# Patient Record
Sex: Female | Born: 1985 | Race: White | Hispanic: No | Smoking: Current every day smoker
Health system: Southern US, Community
[De-identification: ages and names within clinical notes are randomized; demographics above are authoritative.]

## PROBLEM LIST (undated history)

## (undated) ENCOUNTER — Emergency Department: Discharge status: Absent without leave | Payer: Self-pay

## (undated) ENCOUNTER — Inpatient Hospital Stay (HOSPITAL_COMMUNITY): Payer: Self-pay

## (undated) DIAGNOSIS — O24419 Gestational diabetes mellitus in pregnancy, unspecified control: Secondary | ICD-10-CM

## (undated) DIAGNOSIS — Z8751 Personal history of pre-term labor: Secondary | ICD-10-CM

## (undated) DIAGNOSIS — Z720 Tobacco use: Secondary | ICD-10-CM

## (undated) HISTORY — PX: NOSE SURGERY: SHX723

## (undated) HISTORY — DX: Tobacco use: Z72.0

## (undated) HISTORY — DX: Gestational diabetes mellitus in pregnancy, unspecified control: O24.419

---
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
## 2008-07-08 DIAGNOSIS — O24419 Gestational diabetes mellitus in pregnancy, unspecified control: Secondary | ICD-10-CM

## 2008-07-08 HISTORY — DX: Gestational diabetes mellitus in pregnancy, unspecified control: O24.419

## 2010-12-06 ENCOUNTER — Emergency Department (HOSPITAL_COMMUNITY)
Admission: EM | Admit: 2010-12-06 | Discharge: 2010-12-07 | Disposition: A | Discharge status: Home / Self Care | Payer: Self-pay | Attending: Emergency Medicine | Admitting: Emergency Medicine

## 2010-12-06 DIAGNOSIS — M436 Torticollis: Secondary | ICD-10-CM | POA: Insufficient documentation

## 2010-12-06 DIAGNOSIS — M542 Cervicalgia: Secondary | ICD-10-CM | POA: Insufficient documentation

## 2014-08-05 ENCOUNTER — Encounter (HOSPITAL_COMMUNITY): Payer: Self-pay | Admitting: *Deleted

## 2014-08-05 ENCOUNTER — Inpatient Hospital Stay (HOSPITAL_COMMUNITY): Payer: BLUE CROSS/BLUE SHIELD

## 2014-08-05 ENCOUNTER — Inpatient Hospital Stay (HOSPITAL_COMMUNITY)
Admission: AD | Admit: 2014-08-05 | Discharge: 2014-08-05 | Disposition: A | Discharge status: Home / Self Care | Payer: BLUE CROSS/BLUE SHIELD | Source: Ambulatory Visit | Attending: Obstetrics and Gynecology | Admitting: Obstetrics and Gynecology

## 2014-08-05 ENCOUNTER — Other Ambulatory Visit: Payer: Self-pay | Admitting: Advanced Practice Midwife

## 2014-08-05 DIAGNOSIS — O99331 Smoking (tobacco) complicating pregnancy, first trimester: Secondary | ICD-10-CM | POA: Insufficient documentation

## 2014-08-05 DIAGNOSIS — F17210 Nicotine dependence, cigarettes, uncomplicated: Secondary | ICD-10-CM | POA: Diagnosis not present

## 2014-08-05 DIAGNOSIS — O034 Incomplete spontaneous abortion without complication: Secondary | ICD-10-CM

## 2014-08-05 DIAGNOSIS — Z3A01 Less than 8 weeks gestation of pregnancy: Secondary | ICD-10-CM | POA: Diagnosis not present

## 2014-08-05 DIAGNOSIS — O209 Hemorrhage in early pregnancy, unspecified: Secondary | ICD-10-CM | POA: Diagnosis present

## 2014-08-05 LAB — WET PREP, GENITAL
TRICH WET PREP: NONE SEEN
Yeast Wet Prep HPF POC: NONE SEEN

## 2014-08-05 LAB — CBC
HCT: 38.9 % (ref 36.0–46.0)
HEMOGLOBIN: 13.4 g/dL (ref 12.0–15.0)
MCH: 30.3 pg (ref 26.0–34.0)
MCHC: 34.4 g/dL (ref 30.0–36.0)
MCV: 88 fL (ref 78.0–100.0)
Platelets: 231 10*3/uL (ref 150–400)
RBC: 4.42 MIL/uL (ref 3.87–5.11)
RDW: 12.9 % (ref 11.5–15.5)
WBC: 14.3 10*3/uL — ABNORMAL HIGH (ref 4.0–10.5)

## 2014-08-05 LAB — ABO/RH: ABO/RH(D): A POS

## 2014-08-05 LAB — URINALYSIS, ROUTINE W REFLEX MICROSCOPIC
BILIRUBIN URINE: NEGATIVE
GLUCOSE, UA: NEGATIVE mg/dL
KETONES UR: NEGATIVE mg/dL
NITRITE: NEGATIVE
PH: 5.5 (ref 5.0–8.0)
PROTEIN: NEGATIVE mg/dL
Specific Gravity, Urine: 1.025 (ref 1.005–1.030)
UROBILINOGEN UA: 0.2 mg/dL (ref 0.0–1.0)

## 2014-08-05 LAB — URINE MICROSCOPIC-ADD ON

## 2014-08-05 LAB — POCT PREGNANCY, URINE: PREG TEST UR: POSITIVE — AB

## 2014-08-05 LAB — HCG, QUANTITATIVE, PREGNANCY: HCG, BETA CHAIN, QUANT, S: 114051 m[IU]/mL — AB (ref <5)

## 2014-08-05 MED ORDER — OXYCODONE-ACETAMINOPHEN 5-325 MG PO TABS: 1.0000 | ORAL_TABLET | Freq: Four times a day (QID) | ORAL | Status: DC | PRN

## 2014-08-05 MED ORDER — PROMETHAZINE HCL 25 MG PO TABS: 12.5000 mg | ORAL_TABLET | Freq: Four times a day (QID) | ORAL | Status: DC | PRN

## 2014-08-05 MED ORDER — IBUPROFEN 600 MG PO TABS: 600.0000 mg | ORAL_TABLET | Freq: Four times a day (QID) | ORAL | Status: DC | PRN

## 2014-08-05 NOTE — MAU Provider Note (Signed)
Chief Complaint: Vaginal Bleeding   First Provider Initiated Contact with Patient 08/05/14 1018     SUBJECTIVE HPI: Shannon Archer is a 29 y.o. G2P1001 at [redacted]w[redacted]d by LMP who presents to maternity admissions reporting vaginal bleeding in early pregnancy x 2 weeks with onset of clots and tissue today. She is soaking a pad every 3 hours approximately.  The pt describes the tissue as dark/black in color and looking like a balloon.  She reports mild abdominal cramping, denies vaginal itching/burning, urinary symptoms, h/a, dizziness, n/v, or fever/chills.     History reviewed. No pertinent past medical history. Past Surgical History  Procedure Laterality Date  . Cesarean section     History   Social History  . Marital Status: Divorced    Spouse Name: N/A    Number of Children: N/A  . Years of Education: N/A   Occupational History  . Not on file.   Social History Main Topics  . Smoking status: Current Every Day Smoker -- 0.50 packs/day    Types: Cigarettes  . Smokeless tobacco: Never Used  . Alcohol Use: No  . Drug Use: No  . Sexual Activity: Yes   Other Topics Concern  . Not on file   Social History Narrative  . No narrative on file   No current facility-administered medications on file prior to encounter.   No current outpatient prescriptions on file prior to encounter.   Allergies  Allergen Reactions  . Penicillins Nausea And Vomiting    ROS: Pertinent items in HPI  OBJECTIVE Blood pressure 124/63, pulse 93, temperature 98.9 F (37.2 C), temperature source Oral, resp. rate 18, height 5' 1.5" (1.562 m), weight 58.06 kg (128 lb), last menstrual period 06/11/2014. GENERAL: Well-developed, well-nourished female in no acute distress.  HEENT: Normocephalic HEART: normal rate RESP: normal effort ABDOMEN: Soft, non-tender EXTREMITIES: Nontender, no edema NEURO: Alert and oriented Pelvic exam: Cervix pink, visually closed, without lesion, small amount dark red bleeding  mixed with mucus, vaginal walls and external genitalia normal Bimanual exam: Cervix 0/long/high, firm, anterior, neg CMT, uterus nontender, nonenlarged, adnexa without tenderness, enlargement, or mass  LAB RESULTS Results for orders placed or performed during the hospital encounter of 08/05/14 (from the past 24 hour(s))  Urinalysis, Routine w reflex microscopic     Status: Abnormal   Collection Time: 08/05/14  9:40 AM  Result Value Ref Range   Color, Urine YELLOW YELLOW   APPearance HAZY (A) CLEAR   Specific Gravity, Urine 1.025 1.005 - 1.030   pH 5.5 5.0 - 8.0   Glucose, UA NEGATIVE NEGATIVE mg/dL   Hgb urine dipstick LARGE (A) NEGATIVE   Bilirubin Urine NEGATIVE NEGATIVE   Ketones, ur NEGATIVE NEGATIVE mg/dL   Protein, ur NEGATIVE NEGATIVE mg/dL   Urobilinogen, UA 0.2 0.0 - 1.0 mg/dL   Nitrite NEGATIVE NEGATIVE   Leukocytes, UA TRACE (A) NEGATIVE  Urine microscopic-add on     Status: Abnormal   Collection Time: 08/05/14  9:40 AM  Result Value Ref Range   Squamous Epithelial / LPF FEW (A) RARE   WBC, UA 0-2 <3 WBC/hpf   Bacteria, UA FEW (A) RARE   Urine-Other MUCOUS PRESENT   Pregnancy, urine POC     Status: Abnormal   Collection Time: 08/05/14  9:46 AM  Result Value Ref Range   Preg Test, Ur POSITIVE (A) NEGATIVE  CBC     Status: Abnormal   Collection Time: 08/05/14 10:25 AM  Result Value Ref Range   WBC 14.3 (H) 4.0 -  10.5 K/uL   RBC 4.42 3.87 - 5.11 MIL/uL   Hemoglobin 13.4 12.0 - 15.0 g/dL   HCT 16.138.9 09.636.0 - 04.546.0 %   MCV 88.0 78.0 - 100.0 fL   MCH 30.3 26.0 - 34.0 pg   MCHC 34.4 30.0 - 36.0 g/dL   RDW 40.912.9 81.111.5 - 91.415.5 %   Platelets 231 150 - 400 K/uL  hCG, quantitative, pregnancy     Status: Abnormal   Collection Time: 08/05/14 10:25 AM  Result Value Ref Range   hCG, Beta Chain, Quant, S 114051 (H) <5 mIU/mL  ABO/Rh     Status: None (Preliminary result)   Collection Time: 08/05/14 10:25 AM  Result Value Ref Range   ABO/RH(D) A POS   Wet prep, genital     Status:  Abnormal   Collection Time: 08/05/14 10:34 AM  Result Value Ref Range   Yeast Wet Prep HPF POC NONE SEEN NONE SEEN   Trich, Wet Prep NONE SEEN NONE SEEN   Clue Cells Wet Prep HPF POC FEW (A) NONE SEEN   WBC, Wet Prep HPF POC MODERATE (A) NONE SEEN    IMAGING [place] Ob Comp Less 14 Wks  08/05/2014   CLINICAL DATA:  Vaginal bleeding in first trimester pregnancy.  EXAM: OBSTETRIC <14 WK US AND TRANSVAGINAL OB US  TECHNIQUE: Both transabdominal and transvaginal ultrasound examinations were performed for complete evaluation of the gestation as well as the maternal uterus, adnexal regions, and pelvic cul-de-sac. Transvaginal technique was performed to assess early pregnancy.  COMPARISON:  None.  FINDINGS: Intrauterine gestational sac: Visualized/normal in shape.  Yolk sac:  Visualized  Embryo:  Visualized  Cardiac Activity: Absent  CRL:   12  mm   7 w 2 d                  US EDC: 03/22/2015  Maternal uterus/adnexae: Small amount of subchorionic hemorrhage noted in lower uterine segment. Both ovaries are normal in appearance. No adnexal mass or free fluid identified.  IMPRESSION: Findings meet definitive criteria for failed pregnancy. This follows SRU consensus guidelines: Diagnostic Criteria for Nonviable Pregnancy Early in the First Trimester. Macy Mis Engl J Med [phone];369:1443-51.   Electronically Signed   By: Myles RosenthalJohn  Stahl M.D.   On: 08/05/2014 12:21   [place] Ob Transvaginal  08/05/2014   CLINICAL DATA:  Vaginal bleeding in first trimester pregnancy.  EXAM: OBSTETRIC <14 WK US AND TRANSVAGINAL OB US  TECHNIQUE: Both transabdominal and transvaginal ultrasound examinations were performed for complete evaluation of the gestation as well as the maternal uterus, adnexal regions, and pelvic cul-de-sac. Transvaginal technique was performed to assess early pregnancy.  COMPARISON:  None.  FINDINGS: Intrauterine gestational sac: Visualized/normal in shape.  Yolk sac:  Visualized  Embryo:  Visualized  Cardiac Activity: Absent  CRL:    12  mm   7 w 2 d                  US EDC: 03/22/2015  Maternal uterus/adnexae: Small amount of subchorionic hemorrhage noted in lower uterine segment. Both ovaries are normal in appearance. No adnexal mass or free fluid identified.  IMPRESSION: Findings meet definitive criteria for failed pregnancy. This follows SRU consensus guidelines: Diagnostic Criteria for Nonviable Pregnancy Early in the First Trimester. Macy Mis Engl J Med [phone];369:1443-51.   Electronically Signed   By: Myles RosenthalJohn  Stahl M.D.   On: 08/05/2014 12:21    ASSESSMENT 1. Incomplete miscarriage   2. Vaginal bleeding in pregnancy, first trimester  PLAN Discussed options with pt including expectant management, Cytotec, and D&C.  Pt prefers expectant management at this time.   Discharge home with bleeding precautions F/U with [facility] in next 2 weeks.  Pt has appt scheduled for New Ob.  She will call office to reschedule for miscarriage f/u. Return to MAU as needed for emergencies.    Medication List    TAKE these medications        ibuprofen 600 MG tablet  Commonly known as:  ADVIL,MOTRIN  Take 1 tablet (600 mg total) by mouth every 6 (six) hours as needed.     oxyCODONE-acetaminophen 5-325 MG per tablet  Commonly known as:  PERCOCET/ROXICET  Take 1-2 tablets by mouth every 6 (six) hours as needed.     promethazine 25 MG tablet  Commonly known as:  PHENERGAN  Take 0.5-1 tablets (12.5-25 mg total) by mouth every 6 (six) hours as needed.       Follow-up Information    Follow up with Jeani Hawking, MD.   Specialty:  Obstetrics and Gynecology   Why:  As scheduled    Contact information:   [address] [place] [address] [phone]       Follow up with [facility].   Why:  As needed for emergencies   Contact information:   [address] [id number] mc [place] [address] [phone]      Sharen Counter Certified Nurse-Midwife 08/05/2014  12:55 PM

## 2014-08-05 NOTE — Discharge Instructions (Signed)
Incomplete Miscarriage A miscarriage is the sudden loss of an unborn baby (fetus) before the 20th week of pregnancy. In an incomplete miscarriage, parts of the fetus or placenta (afterbirth) remain in the body.  Having a miscarriage can be an emotional experience. Talk with your health care provider about any questions you may have about miscarrying, the grieving process, and your future pregnancy plans. CAUSES   Problems with the fetal chromosomes that make it impossible for the baby to develop normally. Problems with the baby's genes or chromosomes are most often the result of errors that occur by chance as the embryo divides and grows. The problems are not inherited from the parents.  Infection of the cervix or uterus.  Hormone problems.  Problems with the cervix, such as having an incompetent cervix. This is when the tissue in the cervix is not strong enough to hold the pregnancy.  Problems with the uterus, such as an abnormally shaped uterus, uterine fibroids, or congenital abnormalities.  Certain medical conditions.  Smoking, drinking alcohol, or taking illegal drugs.  Trauma. SYMPTOMS   Vaginal bleeding or spotting, with or without cramps or pain.  Pain or cramping in the abdomen or lower back.  Passing fluid, tissue, or blood clots from the vagina. DIAGNOSIS  Your health care provider will perform a physical exam. You may also have an ultrasound to confirm the miscarriage. Blood or urine tests may also be ordered. TREATMENT   Sometimes medications are given and sometimes a dilation and curettage (D&C) procedure is performed. During a D&C procedure, the cervix is widened (dilated) and any remaining fetal or placental tissue is gently removed from the uterus.  Antibiotic medicines are prescribed if there is an infection. Other medicines may be given to reduce the size of the uterus (contract) if there is a lot of bleeding.  If you have Rh negative blood and your baby was Rh  positive, you will need a Rho (D) immune globulin shot. This shot will protect any future baby from having Rh blood problems in future pregnancies.  You may be confined to bed rest. This means you should stay in bed and only get up to use the bathroom. HOME CARE INSTRUCTIONS   Rest as directed by your health care provider.  Restrict activity as directed by your health care provider. You may be allowed to continue light activity if curettage was not done but you require further treatment.  Keep track of the number of pads you use each day. Keep track of how soaked (saturated) they are. Record this information.  Do not  use tampons.  Do not douche or have sexual intercourse until approved by your health care provider.  Keep all follow-up appointments for reevaluation and continuing management.  Only take over-the-counter or prescription medicines for pain, fever, or discomfort as directed by your health care provider.  Take antibiotic medicine as directed by your health care provider. Make sure you finish it even if you start to feel better. SEEK IMMEDIATE MEDICAL CARE IF:   You experience severe cramps in your stomach, back, or abdomen.  You have an unexplained temperature (make sure to record these temperatures).  You pass large clots or tissue (save these for your health care provider to inspect).  Your bleeding increases.  You become light-headed, weak, or have fainting episodes. MAKE SURE YOU:   Understand these instructions.  Will watch your condition.  Will get help right away if you are not doing well or get worse. Document Released: 06/24/2005  Document Revised: 11/08/2013 Document Reviewed: 01/21/2013 [facility] Patient Information 2015 [employer], [place]. This information is not intended to replace advice given to you by your health care provider. Make sure you discuss any questions you have with your health care provider.

## 2014-08-05 NOTE — MAU Note (Signed)
Passed something when used the restroom, looked like a deflated balloon. Has been bleeding for 2 wks.  First appt was scheduled for 02/08.

## 2014-08-08 LAB — GC/CHLAMYDIA PROBE AMP (~~LOC~~) NOT AT ARMC
CHLAMYDIA, DNA PROBE: POSITIVE — AB
Neisseria Gonorrhea: NEGATIVE

## 2014-08-09 ENCOUNTER — Telehealth (HOSPITAL_COMMUNITY): Payer: Self-pay | Admitting: *Deleted

## 2014-08-09 NOTE — Telephone Encounter (Signed)
Telephone call to patient regarding positive chlamydia culture, patient notified.  Patient has not been treated but does have an appointment today with Dr. Arelia SneddonMccomb and will seek treatment at this visit.  Instructed patient to notify her partner for treatment.  Report faxed to health department.

## 2014-08-10 ENCOUNTER — Encounter (HOSPITAL_COMMUNITY): Payer: Self-pay | Admitting: *Deleted

## 2014-08-10 MED ORDER — CLINDAMYCIN PHOSPHATE 900 MG/50ML IV SOLN
900.0000 mg | INTRAVENOUS | Status: AC
  Administered 2014-08-11: 600 mg via INTRAVENOUS
  Filled 2014-08-10: qty 50

## 2014-08-10 MED ORDER — CIPROFLOXACIN IN D5W 400 MG/200ML IV SOLN
400.0000 mg | INTRAVENOUS | Status: AC
  Administered 2014-08-11: 400 mg via INTRAVENOUS
  Filled 2014-08-10: qty 200

## 2014-08-10 NOTE — H&P (Signed)
Shannon Archer is an 29 y.o. female presents for dilation and evacuation for nonviable first trimester pregnancy. Seen in MAU on 1/29 with bleeding sonogram revealed and nonviable pregnancy at 7+ weeks.  Had some bleeding over the weekend. Came in yesterday for f/u sonogram.  Still with nonviable pregnancy at 8 weeks.  Offered cytotec or dilation and evacuation. Patient comes in for the latter.  Pertinent Gynecological History: Menses: regular every month without intermenstrual spotting Bleeding: na Contraception: none DES exposure: denies Blood transfusions: none Sexually transmitted diseases: positive screen for chlamydia on 1/29 treated yesterday with zithromax Previous GYN Procedures: cesarean section  Last mammogram: na Date: na Last pap: unknow Date: na OB History: G2, P1   Menstrual History: Menarche age: [age]  Patient's last menstrual period was 06/11/2014 (exact date).    No past medical history on file.  Past Surgical History  Procedure Laterality Date  . Cesarean section      No family history on file.  Social History:  reports that she has been smoking Cigarettes.  She has been smoking about 0.50 packs per day. She has never used smokeless tobacco. She reports that she does not drink alcohol or use illicit drugs.  Allergies:  Allergies  Allergen Reactions  . Penicillins Nausea And Vomiting    No prescriptions prior to admission    Review of Systems  All other systems reviewed and are negative.   Last menstrual period 06/11/2014. Physical Exam  Constitutional: She is oriented to person, place, and time. She appears well-nourished.  HENT:  Head: Normocephalic.  Eyes: Conjunctivae are normal. Pupils are equal, round, and reactive to light.  Cardiovascular: Normal rate, regular rhythm and normal heart sounds.   Respiratory: Effort normal and breath sounds normal.  GI: Soft. Bowel sounds are normal.  Genitourinary:  Minimal dark blood per vagina. Uterus 9  week in size  Musculoskeletal: Normal range of motion.  Neurological: She is alert and oriented to person, place, and time.    No results found for this or any previous visit (from the past 24 hour(s)).  No results found.  Assessment/Plan: Nonviable first trimester pregnancy Precede with dilation and evacuation. Risks discussed.  Infection.  Hemorrhage that could require transfusions with risk of AID's and hepatitis.  Excessive bleeding could require hysterectomy.  Risk of uterine perforation with injury to adjacent organs such as bowel.  This could require exploratory surgery to repair.  Risk of DVT and PE.  Patient expresses understanding of indications and risks.  Cal Gindlesperger S 08/10/2014, 10:51 AM

## 2014-08-11 ENCOUNTER — Ambulatory Visit (HOSPITAL_COMMUNITY)
Admission: RE | Admit: 2014-08-11 | Discharge: 2014-08-11 | Disposition: A | Discharge status: Home / Self Care | Payer: BLUE CROSS/BLUE SHIELD | Source: Ambulatory Visit | Attending: Obstetrics and Gynecology | Admitting: Obstetrics and Gynecology

## 2014-08-11 ENCOUNTER — Encounter (HOSPITAL_COMMUNITY): Payer: Self-pay | Admitting: *Deleted

## 2014-08-11 ENCOUNTER — Ambulatory Visit (HOSPITAL_COMMUNITY): Payer: BLUE CROSS/BLUE SHIELD | Admitting: Anesthesiology

## 2014-08-11 ENCOUNTER — Encounter (HOSPITAL_COMMUNITY)
Admission: RE | Disposition: A | Discharge status: Home / Self Care | Payer: Self-pay | Source: Ambulatory Visit | Attending: Obstetrics and Gynecology

## 2014-08-11 DIAGNOSIS — O034 Incomplete spontaneous abortion without complication: Secondary | ICD-10-CM | POA: Diagnosis present

## 2014-08-11 DIAGNOSIS — Z3A01 Less than 8 weeks gestation of pregnancy: Secondary | ICD-10-CM | POA: Insufficient documentation

## 2014-08-11 DIAGNOSIS — Z880 Allergy status to penicillin: Secondary | ICD-10-CM | POA: Insufficient documentation

## 2014-08-11 DIAGNOSIS — O021 Missed abortion: Secondary | ICD-10-CM | POA: Insufficient documentation

## 2014-08-11 DIAGNOSIS — F17210 Nicotine dependence, cigarettes, uncomplicated: Secondary | ICD-10-CM | POA: Insufficient documentation

## 2014-08-11 HISTORY — PX: DILATION AND EVACUATION: SHX1459

## 2014-08-11 LAB — CBC
HEMATOCRIT: 37.2 % (ref 36.0–46.0)
Hemoglobin: 12.6 g/dL (ref 12.0–15.0)
MCH: 30.2 pg (ref 26.0–34.0)
MCHC: 33.9 g/dL (ref 30.0–36.0)
MCV: 89.2 fL (ref 78.0–100.0)
Platelets: 234 10*3/uL (ref 150–400)
RBC: 4.17 MIL/uL (ref 3.87–5.11)
RDW: 13.4 % (ref 11.5–15.5)
WBC: 11.7 10*3/uL — AB (ref 4.0–10.5)

## 2014-08-11 SURGERY — DILATION AND EVACUATION, UTERUS
Anesthesia: Monitor Anesthesia Care | Site: Vagina

## 2014-08-11 MED ORDER — MEPERIDINE HCL 25 MG/ML IJ SOLN: 6.2500 mg | INTRAMUSCULAR | Status: DC | PRN

## 2014-08-11 MED ORDER — DEXAMETHASONE SODIUM PHOSPHATE 10 MG/ML IJ SOLN
INTRAMUSCULAR | Status: DC | PRN
  Administered 2014-08-11: 4 mg via INTRAVENOUS

## 2014-08-11 MED ORDER — FENTANYL CITRATE 0.05 MG/ML IJ SOLN
INTRAMUSCULAR | Status: AC
  Filled 2014-08-11: qty 5

## 2014-08-11 MED ORDER — SCOPOLAMINE 1 MG/3DAYS TD PT72
1.0000 | MEDICATED_PATCH | Freq: Once | TRANSDERMAL | Status: DC
  Administered 2014-08-11: 1.5 mg via TRANSDERMAL

## 2014-08-11 MED ORDER — FUROSEMIDE 10 MG/ML IJ SOLN
INTRAMUSCULAR | Status: AC
  Filled 2014-08-11: qty 2

## 2014-08-11 MED ORDER — PROPOFOL 10 MG/ML IV EMUL
INTRAVENOUS | Status: DC | PRN
  Administered 2014-08-11 (×2): 20 mg via INTRAVENOUS
  Administered 2014-08-11: 30 mg via INTRAVENOUS
  Administered 2014-08-11 (×3): 20 mg via INTRAVENOUS

## 2014-08-11 MED ORDER — FENTANYL CITRATE 0.05 MG/ML IJ SOLN
INTRAMUSCULAR | Status: DC | PRN
  Administered 2014-08-11: 50 ug via INTRAVENOUS
  Administered 2014-08-11: 100 ug via INTRAVENOUS

## 2014-08-11 MED ORDER — ONDANSETRON HCL 4 MG/2ML IJ SOLN: 4.0000 mg | Freq: Once | INTRAMUSCULAR | Status: DC | PRN

## 2014-08-11 MED ORDER — KETOROLAC TROMETHAMINE 30 MG/ML IJ SOLN
INTRAMUSCULAR | Status: AC
  Filled 2014-08-11: qty 1

## 2014-08-11 MED ORDER — PROPOFOL 10 MG/ML IV BOLUS
INTRAVENOUS | Status: AC
  Filled 2014-08-11: qty 40

## 2014-08-11 MED ORDER — DEXAMETHASONE SODIUM PHOSPHATE 4 MG/ML IJ SOLN
INTRAMUSCULAR | Status: AC
  Filled 2014-08-11: qty 1

## 2014-08-11 MED ORDER — FENTANYL CITRATE 0.05 MG/ML IJ SOLN: 25.0000 ug | INTRAMUSCULAR | Status: DC | PRN

## 2014-08-11 MED ORDER — KETOROLAC TROMETHAMINE 30 MG/ML IJ SOLN
INTRAMUSCULAR | Status: DC | PRN
  Administered 2014-08-11: 30 mg via INTRAVENOUS

## 2014-08-11 MED ORDER — LACTATED RINGERS IV SOLN
INTRAVENOUS | Status: DC
  Administered 2014-08-11 (×2): via INTRAVENOUS

## 2014-08-11 MED ORDER — LIDOCAINE HCL (PF) 1 % IJ SOLN
INTRAMUSCULAR | Status: AC
  Filled 2014-08-11: qty 5

## 2014-08-11 MED ORDER — MIDAZOLAM HCL 2 MG/2ML IJ SOLN
INTRAMUSCULAR | Status: AC
  Filled 2014-08-11: qty 2

## 2014-08-11 MED ORDER — ONDANSETRON HCL 4 MG/2ML IJ SOLN
INTRAMUSCULAR | Status: AC
  Filled 2014-08-11: qty 6

## 2014-08-11 MED ORDER — SCOPOLAMINE 1 MG/3DAYS TD PT72
MEDICATED_PATCH | TRANSDERMAL | Status: AC
  Administered 2014-08-11: 1.5 mg via TRANSDERMAL
  Filled 2014-08-11: qty 1

## 2014-08-11 MED ORDER — ONDANSETRON HCL 4 MG/2ML IJ SOLN
INTRAMUSCULAR | Status: DC | PRN
  Administered 2014-08-11: 4 mg via INTRAVENOUS

## 2014-08-11 MED ORDER — CHLOROPROCAINE HCL 1 % IJ SOLN
INTRAMUSCULAR | Status: DC | PRN
  Administered 2014-08-11: 20 mL

## 2014-08-11 MED ORDER — MIDAZOLAM HCL 2 MG/2ML IJ SOLN
INTRAMUSCULAR | Status: DC | PRN
  Administered 2014-08-11: 2 mg via INTRAVENOUS

## 2014-08-11 MED ORDER — ONDANSETRON HCL 4 MG/2ML IJ SOLN
INTRAMUSCULAR | Status: AC
  Filled 2014-08-11: qty 2

## 2014-08-11 MED ORDER — LIDOCAINE HCL (CARDIAC) 20 MG/ML IV SOLN
INTRAVENOUS | Status: DC | PRN
  Administered 2014-08-11: 30 mg via INTRAVENOUS

## 2014-08-11 MED ORDER — PROPOFOL 10 MG/ML IV BOLUS
INTRAVENOUS | Status: AC
  Filled 2014-08-11: qty 20

## 2014-08-11 MED ORDER — OXYCODONE-ACETAMINOPHEN 7.5-325 MG PO TABS: 1.0000 | ORAL_TABLET | ORAL | Status: DC | PRN

## 2014-08-11 MED ORDER — CHLOROPROCAINE HCL 1 % IJ SOLN
INTRAMUSCULAR | Status: AC
Start: 2014-08-11 — End: 2014-08-11
  Filled 2014-08-11: qty 30

## 2014-08-11 SURGICAL SUPPLY — 16 items
CLOTH BEACON ORANGE TIMEOUT ST (SAFETY) ×3 IMPLANT
DECANTER SPIKE VIAL GLASS SM (MISCELLANEOUS) ×3 IMPLANT
GLOVE BIO SURGEON STRL SZ7 (GLOVE) ×3 IMPLANT
GOWN STRL REUS W/TWL LRG LVL3 (GOWN DISPOSABLE) ×6 IMPLANT
KIT BERKELEY 1ST TRIMESTER 3/8 (MISCELLANEOUS) ×3 IMPLANT
NEEDLE SPNL 20GX3.5 QUINCKE YW (NEEDLE) ×3 IMPLANT
NS IRRIG 1000ML POUR BTL (IV SOLUTION) ×3 IMPLANT
PACK VAGINAL MINOR WOMEN LF (CUSTOM PROCEDURE TRAY) ×3 IMPLANT
PAD OB MATERNITY 4.3X12.25 (PERSONAL CARE ITEMS) ×3 IMPLANT
PAD PREP 24X48 CUFFED NSTRL (MISCELLANEOUS) ×3 IMPLANT
SET BERKELEY SUCTION TUBING (SUCTIONS) ×3 IMPLANT
TOWEL OR 17X24 6PK STRL BLUE (TOWEL DISPOSABLE) ×6 IMPLANT
VACURETTE 10 RIGID CVD (CANNULA) IMPLANT
VACURETTE 7MM CVD STRL WRAP (CANNULA) IMPLANT
VACURETTE 8 RIGID CVD (CANNULA) ×3 IMPLANT
VACURETTE 9 RIGID CVD (CANNULA) IMPLANT

## 2014-08-11 NOTE — Op Note (Signed)
Shannon Archer, Shannon Archer             ACCOUNT NO.:  [id number]  MEDICAL RECORD NO.:  [id number]  LOCATION:  [facility]                          [facility]ACILITY:  [facility]  PHYSICIAN:  Juluis MireJohn S. Neaveh Belanger, M.D.   DATE OF BIRTH:  [birth year]  DATE OF PROCEDURE:  08/11/2014 DATE OF DISCHARGE:  08/11/2014                              OPERATIVE REPORT   PREOPERATIVE DIAGNOSIS:  Nonviable first trimester pregnancy.  POSTOPERATIVE DIAGNOSIS:  Nonviable first trimester pregnancy.  PROCEDURE:  Paracervical block.  Cervical dilatation with intrauterine evacuation.  SURGEON:  Juluis MireJohn S. Jayceion Lisenby, M.D.  ANESTHESIA:  Paracervical block and sedation.  BLOOD LOSS:  Minimal.  PACKS:  None.  DRAINS:  None.  INTRAOPERATIVE BLOOD PLACED:  None.  COMPLICATIONS:  None.  INDICATION:  As dictated in history and physical.  PROCEDURE NOTE:  The patient was taken to OR and placed in supine position.  After sedation was placed in dorsal lithotomy position using the Allen stirrups, the patient was draped in sterile field.  A speculum was placed in the vaginal vault.  Cervix and vagina were cleansed with Betadine.  Anterior lip of the cervix was anesthetized with 1% Nesacaine and secured with a single-tooth tenaculum.  Paracervical block was instituted using 20 mL of 1% Nesacaine.  Uterus sounded to approximately 10 cm.  Cervix was dilated to a size #25 Pratt dilator.  A size 8 curved suction curette was introduced and intrauterine contents were emptied using suction curetting.  This was continued till no additional tissue was obtained.  We then brought in a sharp curette and started curetting, we felt that the anterior wall still had some additional tissue. Repeated suction curetting and did obtain additional tissue.  After that with sharp curetting, we felt all quadrants were cleared by the gritty field.  The uterus was contracting down well. There was no active bleeding or signs of perforation.  At this point in time, the  patient taken out of dorsal lithotomy position.  Once alert, transferred to recovery in good condition.  Sponge, instrument, and needle count was reported correct by the circulating nurse.  The patient is Rh positive.  RhoGAM will not be required.     Juluis MireJohn S. Mohsen Odenthal, M.D.     JSM/MEDQ  D:  08/11/2014  T:  08/11/2014  Job:  [id number]

## 2014-08-11 NOTE — H&P (Signed)
  History and physical exam unchangedHistory and physical exam unchangedHistory and physical exam unchanged 

## 2014-08-11 NOTE — Discharge Instructions (Signed)
Incomplete Miscarriage A miscarriage is the sudden loss of an unborn baby (fetus) before the 20th week of pregnancy. In an incomplete miscarriage, parts of the fetus or placenta (afterbirth) remain in the body.  Having a miscarriage can be an emotional experience. Talk with your health care provider about any questions you may have about miscarrying, the grieving process, and your future pregnancy plans. CAUSES   Problems with the fetal chromosomes that make it impossible for the baby to develop normally. Problems with the baby's genes or chromosomes are most often the result of errors that occur by chance as the embryo divides and grows. The problems are not inherited from the parents.  Infection of the cervix or uterus.  Hormone problems.  Problems with the cervix, such as having an incompetent cervix. This is when the tissue in the cervix is not strong enough to hold the pregnancy.  Problems with the uterus, such as an abnormally shaped uterus, uterine fibroids, or congenital abnormalities.  Certain medical conditions.  Smoking, drinking alcohol, or taking illegal drugs.  Trauma. SYMPTOMS   Vaginal bleeding or spotting, with or without cramps or pain.  Pain or cramping in the abdomen or lower back.  Passing fluid, tissue, or blood clots from the vagina. DIAGNOSIS  Your health care provider will perform a physical exam. You may also have an ultrasound to confirm the miscarriage. Blood or urine tests may also be ordered. TREATMENT   Usually, a dilation and curettage (D&C) procedure is performed. During a D&C procedure, the cervix is widened (dilated) and any remaining fetal or placental tissue is gently removed from the uterus.  Antibiotic medicines are prescribed if there is an infection. Other medicines may be given to reduce the size of the uterus (contract) if there is a lot of bleeding.  If you have Rh negative blood and your baby was Rh positive, you will need a Rho (D)  immune globulin shot. This shot will protect any future baby from having Rh blood problems in future pregnancies.  You may be confined to bed rest. This means you should stay in bed and only get up to use the bathroom. HOME CARE INSTRUCTIONS   Rest as directed by your health care provider.  Restrict activity as directed by your health care provider. You may be allowed to continue light activity if curettage was not done but you require further treatment.  Keep track of the number of pads you use each day. Keep track of how soaked (saturated) they are. Record this information.  Do not  use tampons.  Do not douche or have sexual intercourse until approved by your health care provider.  Keep all follow-up appointments for reevaluation and continuing management.  Only take over-the-counter or prescription medicines for pain, fever, or discomfort as directed by your health care provider.  Take antibiotic medicine as directed by your health care provider. Make sure you finish it even if you start to feel better. SEEK IMMEDIATE MEDICAL CARE IF:   You experience severe cramps in your stomach, back, or abdomen.  You have an unexplained temperature (make sure to record these temperatures).  You pass large clots or tissue (save these for your health care provider to inspect).  Your bleeding increases.  You become light-headed, weak, or have fainting episodes. MAKE SURE YOU:   Understand these instructions.  Will watch your condition.  Will get help right away if you are not doing well or get worse. Document Released: 06/24/2005 Document Revised: 11/08/2013 Document Reviewed:   01/21/2013 [employer] Patient Information 2015 [employer]. This information is not intended to replace advice given to you by your health care provider. Make sure you discuss any questions you have with your health care provider.  

## 2014-08-11 NOTE — Anesthesia Preprocedure Evaluation (Signed)
Anesthesia Evaluation  Patient identified by MRN, date of birth, ID band Patient awake    Reviewed: Allergy & Precautions, H&P , NPO status , Patient's Chart, lab work & pertinent test results, reviewed documented beta blocker date and time   Airway Mallampati: I  TM Distance: >3 FB Neck ROM: full    Dental no notable dental hx. (+) Teeth Intact   Pulmonary Current Smoker,    Pulmonary exam normal       Cardiovascular negative cardio ROS      Neuro/Psych negative neurological ROS  negative psych ROS   GI/Hepatic negative GI ROS, Neg liver ROS,   Endo/Other  negative endocrine ROS  Renal/GU negative Renal ROS     Musculoskeletal   Abdominal Normal abdominal exam  (+)   Peds  Hematology negative hematology ROS (+)   Anesthesia Other Findings   Reproductive/Obstetrics negative OB ROS                             Anesthesia Physical Anesthesia Plan  ASA: II  Anesthesia Plan: MAC   Post-op Pain Management:    Induction: Intravenous  Airway Management Planned:   Additional Equipment:   Intra-op Plan:   Post-operative Plan:   Informed Consent: I have reviewed the patients History and Physical, chart, labs and discussed the procedure including the risks, benefits and alternatives for the proposed anesthesia with the patient or authorized representative who has indicated his/her understanding and acceptance.     Plan Discussed with: CRNA and Surgeon  Anesthesia Plan Comments:         Anesthesia Quick Evaluation

## 2014-08-11 NOTE — Brief Op Note (Signed)
08/11/2014  1:30 PM  PATIENT:  Shannon PaddockNatasha Archer  29 y.o. female  PRE-OPERATIVE DIAGNOSIS:  missed abortion 1st trimester  POST-OPERATIVE DIAGNOSIS:  * No post-op diagnosis entered *  PROCEDURE:  Procedure(s): DILATATION AND EVACUATION (N/A)  SURGEON:  Surgeon(s) and Role:    * Juluis MireJohn S Mell Guia, MD - Primary  PHYSICIAN ASSISTANT:   ASSISTANTS: none   ANESTHESIA:   IV sedation and paracervical block  EBL:  Total I/O In: 1000 [I.V.:1000] Out: -   BLOOD ADMINISTERED:none  DRAINS: none   LOCAL MEDICATIONS USED:  OTHER nesicaine  SPECIMEN:  Source of Specimen:  products of conception  DISPOSITION OF SPECIMEN:  PATHOLOGY  COUNTS:  YES  TOURNIQUET:  * No tourniquets in log *  DICTATION: .Other Dictation: Dictation Number [id number]  PLAN OF CARE: Discharge to home after PACU  PATIENT DISPOSITION:  PACU - hemodynamically stable.   Delay start of Pharmacological VTE agent (>24hrs) due to surgical blood loss or risk of bleeding: not applicable

## 2014-08-11 NOTE — Anesthesia Postprocedure Evaluation (Signed)
Anesthesia Post Note  Patient: Shannon PaddockNatasha Archer  Procedure(s) Performed: Procedure(s) (LRB): DILATATION AND EVACUATION (N/A)  Anesthesia type: MAC  Patient location: PACU  Post pain: Pain level controlled  Post assessment: Post-op Vital signs reviewed  Last Vitals:  Filed Vitals:   08/11/14 1400  BP: 98/51  Pulse: 65  Temp:   Resp: 17    Post vital signs: Reviewed  Level of consciousness: sedated  Complications: No apparent anesthesia complications

## 2014-08-11 NOTE — Transfer of Care (Signed)
Immediate Anesthesia Transfer of Care Note  Patient: Shannon PaddockNatasha Archer  Procedure(s) Performed: Procedure(s): DILATATION AND EVACUATION (N/A)  Patient Location: PACU  Anesthesia Type:MAC  Level of Consciousness: awake, alert  and oriented  Airway & Oxygen Therapy: Patient Spontanous Breathing and Patient connected to nasal cannula oxygen  Post-op Assessment: Report given to RN and Post -op Vital signs reviewed and stable  Post vital signs: Reviewed and stable  Last Vitals:  Filed Vitals:   08/11/14 1125  BP: 101/88  Pulse: 83  Temp: 36.6 C  Resp: 18    Complications: No apparent anesthesia complications

## 2014-08-12 ENCOUNTER — Encounter (HOSPITAL_COMMUNITY): Payer: Self-pay | Admitting: Obstetrics and Gynecology

## 2014-12-30 ENCOUNTER — Emergency Department (HOSPITAL_COMMUNITY)
Admission: EM | Admit: 2014-12-30 | Discharge: 2014-12-30 | Discharge status: Against Medical Advice | Payer: BLUE CROSS/BLUE SHIELD | Attending: Emergency Medicine | Admitting: Emergency Medicine

## 2014-12-30 ENCOUNTER — Encounter (HOSPITAL_COMMUNITY): Payer: Self-pay | Admitting: Emergency Medicine

## 2014-12-30 DIAGNOSIS — Z720 Tobacco use: Secondary | ICD-10-CM | POA: Diagnosis not present

## 2014-12-30 DIAGNOSIS — R1084 Generalized abdominal pain: Secondary | ICD-10-CM | POA: Insufficient documentation

## 2014-12-30 LAB — URINE MICROSCOPIC-ADD ON

## 2014-12-30 LAB — URINALYSIS, ROUTINE W REFLEX MICROSCOPIC
Bilirubin Urine: NEGATIVE
Glucose, UA: NEGATIVE mg/dL
Hgb urine dipstick: NEGATIVE
Ketones, ur: NEGATIVE mg/dL
Nitrite: NEGATIVE
PH: 7.5 (ref 5.0–8.0)
Protein, ur: NEGATIVE mg/dL
Specific Gravity, Urine: 1.017 (ref 1.005–1.030)
UROBILINOGEN UA: 0.2 mg/dL (ref 0.0–1.0)

## 2014-12-30 LAB — POC URINE PREG, ED: Preg Test, Ur: NEGATIVE

## 2014-12-30 NOTE — ED Notes (Signed)
Called x1. No answer.

## 2014-12-30 NOTE — ED Notes (Signed)
Pt. Stated, I've had abdominal pain since yesterday., No other symptoms.

## 2015-02-28 ENCOUNTER — Emergency Department (HOSPITAL_COMMUNITY)
Admission: EM | Admit: 2015-02-28 | Discharge: 2015-02-28 | Disposition: A | Discharge status: Home / Self Care | Payer: BLUE CROSS/BLUE SHIELD | Source: Home / Self Care | Attending: Emergency Medicine | Admitting: Emergency Medicine

## 2015-02-28 ENCOUNTER — Encounter (HOSPITAL_COMMUNITY): Payer: Self-pay | Admitting: Emergency Medicine

## 2015-02-28 DIAGNOSIS — N390 Urinary tract infection, site not specified: Secondary | ICD-10-CM

## 2015-02-28 LAB — POCT URINALYSIS DIP (DEVICE)
Bilirubin Urine: NEGATIVE
Glucose, UA: NEGATIVE mg/dL
Ketones, ur: NEGATIVE mg/dL
Nitrite: POSITIVE — AB
PROTEIN: 30 mg/dL — AB
Urobilinogen, UA: 0.2 mg/dL (ref 0.0–1.0)
pH: 6 (ref 5.0–8.0)

## 2015-02-28 MED ORDER — CEPHALEXIN 500 MG PO CAPS: 500.0000 mg | ORAL_CAPSULE | Freq: Four times a day (QID) | ORAL | Status: DC

## 2015-02-28 NOTE — ED Notes (Signed)
Pt states she is able to urinate, but she feels she is not completely emptying her bladder when she goes.  She has no itching, discharge, or odor.  She denies any flank pain.  She has had similar symptoms in the past and it was a UTI.

## 2015-02-28 NOTE — ED Provider Notes (Signed)
CSN: [id number]     Arrival date & time 02/28/15  1319 History   First MD Initiated Contact with Patient 02/28/15 1553     Chief Complaint  Patient presents with  . Urinary Tract Infection   (Consider location/radiation/quality/duration/timing/severity/associated sxs/prior Treatment) HPI Comments: Urinary frequency, urgency, low volume voids, sensation of not emptying bladder post void.   Patient is a 29 y.o. female presenting with dysuria.  Dysuria Pain quality:  Burning Pain severity:  Mild Onset quality:  Sudden Duration:  1 day Timing:  Constant Progression:  Worsening Chronicity:  New Recent urinary tract infections: no   Relieved by:  Nothing Worsened by:  Nothing tried Ineffective treatments:  None tried Urinary symptoms: hesitancy   Urinary symptoms: no discolored urine, no foul-smelling urine, no hematuria and no bladder incontinence   Associated symptoms: no fever, no flank pain, no genital lesions, no nausea, no vaginal discharge and no vomiting   Risk factors: no hx of pyelonephritis, not pregnant, no renal disease, not single kidney and no urinary catheter     History reviewed. No pertinent past medical history. Past Surgical History  Procedure Laterality Date  . Cesarean section    . Dilation and evacuation N/A 08/11/2014    Procedure: DILATATION AND EVACUATION;  Surgeon: Juluis Mire, MD;  Location: [facility];  Service: Gynecology;  Laterality: N/A;   History reviewed. No pertinent family history. Social History  Substance Use Topics  . Smoking status: Current Every Day Smoker -- 0.50 packs/day    Types: Cigarettes  . Smokeless tobacco: Never Used  . Alcohol Use: No   OB History    Gravida Para Term Preterm AB TAB SAB Ectopic Multiple Living   Review of Systems  Constitutional: Negative for fever, activity change and fatigue.  Respiratory: Negative for cough and shortness of breath.   Gastrointestinal: Negative.  Negative for nausea and  vomiting.  Genitourinary: Positive for dysuria, urgency and frequency. Negative for flank pain, vaginal bleeding, vaginal discharge, vaginal pain, menstrual problem and pelvic pain.  Musculoskeletal: Negative.   Skin: Negative for rash.  Neurological: Negative for dizziness and headaches.    Allergies  Penicillins  Home Medications   Prior to Admission medications   Medication Sig Start Date End Date Taking? Authorizing Provider  azithromycin (ZITHROMAX) 1 G powder Take 1 g by mouth once.    Historical Provider, MD  cephALEXin (KEFLEX) 500 MG capsule Take 1 capsule (500 mg total) by mouth 4 (four) times daily. 02/28/15   Hayden Rasmussen, NP  ibuprofen (ADVIL,MOTRIN) 600 MG tablet Take 1 tablet (600 mg total) by mouth every 6 (six) hours as needed. 08/05/14   Wilmer Floor Leftwich-Kirby, CNM  oxyCODONE-acetaminophen (PERCOCET) 7.5-325 MG per tablet Take 1 tablet by mouth every 4 (four) hours as needed for pain. 08/11/14   Richardean Chimera, MD  oxyCODONE-acetaminophen (PERCOCET/ROXICET) 5-325 MG per tablet Take 1-2 tablets by mouth every 6 (six) hours as needed. 08/05/14   Misty Stanley A Leftwich-Kirby, CNM  promethazine (PHENERGAN) 25 MG tablet Take 0.5-1 tablets (12.5-25 mg total) by mouth every 6 (six) hours as needed. 08/05/14   Wilmer Floor Leftwich-Kirby, CNM   BP 117/82 mmHg  Pulse 86  Temp(Src) 97.6 F (36.4 C) (Oral)  Resp 18  SpO2 98%  LMP 02/20/2015  Breastfeeding? Unknown Physical Exam  Constitutional: She is oriented to person, place, and time. She appears well-developed and well-nourished. No distress.  Neck: Normal range  of motion.  Cardiovascular: Normal rate.   Pulmonary/Chest: Effort normal. No respiratory distress.  Abdominal: Soft. There is no tenderness.  Neurological: She is alert and oriented to person, place, and time.  Skin: Skin is warm and dry.  Psychiatric: She has a normal mood and affect.  Nursing note and vitals reviewed.   ED Course  Procedures (including critical care time) Labs  Review Labs Reviewed  POCT URINALYSIS DIP (DEVICE) - Abnormal; Notable for the following:    Hgb urine dipstick MODERATE (*)    Protein, ur 30 (*)    Nitrite POSITIVE (*)    Leukocytes, UA TRACE (*)    All other components within normal limits  URINE CULTURE    Imaging Review No results found.   MDM   1. UTI (lower urinary tract infection)     May also take AZO Standard for urinary symptoms now. 1-2 tablets 3 times a day for up to 3 days as needed. Will turn urine reddish orange. Lots of fluids Keflex as dir Urine cult pending    Hayden Rasmussen, NP 02/28/15 [phone]

## 2015-02-28 NOTE — Discharge Instructions (Signed)
Antibiotic Medication  May also take AZO Standard for urinary symptoms now. 1-2 tablets 3 times a day for up to 3 days as needed. Will turn urine reddish orange. Lots of fluids   Antibiotics are among the most frequently prescribed medicines. Antibiotics cure illness by assisting our body to injure or kill the bacteria that cause infection. While antibiotics are useful to treat a wide variety of infections they are useless against viruses. Antibiotics cannot cure colds, flu, or other viral infections.  There are many types of antibiotics available. Your caregiver will decide which antibiotic will be useful for an illness. Never take or give someone else's antibiotics or left over medicine. Your caregiver may also take into account:  Allergies.  The cost of the medicine.  Dosing schedules.  Taste.  Common side effects when choosing an antibiotic for an infection. Ask your caregiver if you have questions about why a certain medicine was chosen. HOME CARE INSTRUCTIONS Read all instructions and labels on medicine bottles carefully. Some antibiotics should be taken on an empty stomach while others should be taken with food. Taking antibiotics incorrectly may reduce how well they work. Some antibiotics need to be kept in the refrigerator. Others should be kept at room temperature. Ask your caregiver or pharmacist if you do not understand how to give the medicine. Be sure to give the amount of medicine your caregiver has prescribed. Even if you feel better and your symptoms improve, bacteria may still remain alive in the body. Taking all of the medicine will prevent:  The infection from returning and becoming harder to treat.  Complications from partially treated infections. If there is any medicine left over after you have taken the medicine as your caregiver has instructed, throw the medicine away. Be sure to tell your caregiver if you:  Are allergic to any medicines.  Are pregnant or intend  to become pregnant while using this medicine.  Are breastfeeding.  Are taking any other prescription, non-prescription medicine, or herbal remedies.  Have any other medical conditions or problems you have not already discussed. If you are taking birth control pills, they may not work while you are on antibiotics. To avoid unwanted pregnancy:  Continue taking your birth control pills as usual.  Use a second form of birth control (such as condoms) while you are taking antibiotic medicine.  When you finish taking the antibiotic medicine, continue using the second form of birth control until you are finished with your current 1 month cycle of birth control pills. Try not to miss any doses of medicine. If you miss a dose, take it as soon as possible. However, if it is almost time for the next dose and the dosing schedule is:  2 doses a day, take the missed dose and the next dose 5 to 6 hours apart.  3 or more doses a day, take the missed dose and the next dose 2 to 4 hours apart, then go back to the normal schedule.  If you are unable to make up a missed dose, take the next scheduled dose on time and complete the missed dose at the end of the prescribed time for your medicine. SIDE EFFECTS TO TAKING ANTIBIOTICS Common side effects to antibiotic use include:  Soft stools or diarrhea.  Mild stomach upset.  Sun sensitivity. SEEK MEDICAL CARE IF:   If you get worse or do not improve within a few days of starting the medicine.  Vomiting develops.  Diaper rash or rash on the  genitals appears.  Vaginal itching occurs.  White patches appear on the tongue or in the mouth.  Severe watery diarrhea and abdominal cramps occur.  Signs of an allergy develop (hives, unknown itchy rash appears). STOP TAKING THE ANTIBIOTIC. SEEK IMMEDIATE MEDICAL CARE IF:   Urine turns dark or blood colored.  Skin turns yellow.  Easy bruising or bleeding occurs.  Joint pain or muscle aches  occur.  Fever returns.  Severe headache occurs.  Signs of an allergy develop (trouble breathing, wheezing, swelling of the lips, face or tongue, fainting, or blisters on the skin or in the mouth). STOP TAKING THE ANTIBIOTIC. Document Released: 03/06/2004 Document Revised: 09/16/2011 Document Reviewed: 03/16/2009 [facility] Patient Information 2015 [place], [place]. This information is not intended to replace advice given to you by your health care provider. Make sure you discuss any questions you have with your health care provider.  Urinary Tract Infection Urinary tract infections (UTIs) can develop anywhere along your urinary tract. Your urinary tract is your body's drainage system for removing wastes and extra water. Your urinary tract includes two kidneys, two ureters, a bladder, and a urethra. Your kidneys are a pair of bean-shaped organs. Each kidney is about the size of your fist. They are located below your ribs, one on each side of your spine. CAUSES Infections are caused by microbes, which are microscopic organisms, including fungi, viruses, and bacteria. These organisms are so small that they can only be seen through a microscope. Bacteria are the microbes that most commonly cause UTIs. SYMPTOMS  Symptoms of UTIs may vary by age and gender of the patient and by the location of the infection. Symptoms in young women typically include a frequent and intense urge to urinate and a painful, burning feeling in the bladder or urethra during urination. Older women and men are more likely to be tired, shaky, and weak and have muscle aches and abdominal pain. A fever may mean the infection is in your kidneys. Other symptoms of a kidney infection include pain in your back or sides below the ribs, nausea, and vomiting. DIAGNOSIS To diagnose a UTI, your caregiver will ask you about your symptoms. Your caregiver also will ask to provide a urine sample. The urine sample will be tested for bacteria and  white blood cells. White blood cells are made by your body to help fight infection. TREATMENT  Typically, UTIs can be treated with medication. Because most UTIs are caused by a bacterial infection, they usually can be treated with the use of antibiotics. The choice of antibiotic and length of treatment depend on your symptoms and the type of bacteria causing your infection. HOME CARE INSTRUCTIONS  If you were prescribed antibiotics, take them exactly as your caregiver instructs you. Finish the medication even if you feel better after you have only taken some of the medication.  Drink enough water and fluids to keep your urine clear or pale yellow.  Avoid caffeine, tea, and carbonated beverages. They tend to irritate your bladder.  Empty your bladder often. Avoid holding urine for long periods of time.  Empty your bladder before and after sexual intercourse.  After a bowel movement, women should cleanse from front to back. Use each tissue only once. SEEK MEDICAL CARE IF:   You have back pain.  You develop a fever.  Your symptoms do not begin to resolve within 3 days. SEEK IMMEDIATE MEDICAL CARE IF:   You have severe back pain or lower abdominal pain.  You develop chills.  You have nausea or vomiting.  You have continued burning or discomfort with urination. MAKE SURE YOU:   Understand these instructions.  Will watch your condition.  Will get help right away if you are not doing well or get worse. Document Released: 04/03/2005 Document Revised: 12/24/2011 Document Reviewed: 08/02/2011 [facility] Patient Information 2015 [place], [place]. This information is not intended to replace advice given to you by your health care provider. Make sure you discuss any questions you have with your health care provider.

## 2015-03-03 LAB — URINE CULTURE: SPECIAL REQUESTS: NORMAL

## 2015-03-03 NOTE — ED Notes (Signed)
Final report of UA culture positive for E coli ; treatment adequate w Rx provided

## 2015-10-08 ENCOUNTER — Encounter (HOSPITAL_COMMUNITY): Payer: Self-pay | Admitting: Emergency Medicine

## 2015-10-08 ENCOUNTER — Emergency Department (INDEPENDENT_AMBULATORY_CARE_PROVIDER_SITE_OTHER)
Admission: EM | Admit: 2015-10-08 | Discharge: 2015-10-08 | Disposition: A | Discharge status: Home / Self Care | Payer: Self-pay | Source: Home / Self Care | Attending: Family Medicine | Admitting: Family Medicine

## 2015-10-08 ENCOUNTER — Emergency Department (HOSPITAL_COMMUNITY)
Admission: EM | Admit: 2015-10-08 | Discharge: 2015-10-08 | Discharge status: Absent without leave | Payer: BLUE CROSS/BLUE SHIELD

## 2015-10-08 DIAGNOSIS — K047 Periapical abscess without sinus: Secondary | ICD-10-CM

## 2015-10-08 MED ORDER — KETOROLAC TROMETHAMINE 60 MG/2ML IM SOLN
60.0000 mg | Freq: Once | INTRAMUSCULAR | Status: AC
  Administered 2015-10-08: 60 mg via INTRAMUSCULAR

## 2015-10-08 MED ORDER — NAPROXEN 500 MG PO TABS: 500.0000 mg | ORAL_TABLET | Freq: Two times a day (BID) | ORAL | Status: DC

## 2015-10-08 MED ORDER — AMOXICILLIN-POT CLAVULANATE 875-125 MG PO TABS: 1.0000 | ORAL_TABLET | Freq: Two times a day (BID) | ORAL | Status: DC

## 2015-10-08 MED ORDER — KETOROLAC TROMETHAMINE 60 MG/2ML IM SOLN
INTRAMUSCULAR | Status: AC
  Filled 2015-10-08: qty 2

## 2015-10-08 NOTE — ED Provider Notes (Signed)
CSN: [id number]     Arrival date & time 10/08/15  1939 History   First MD Initiated Contact with Patient 10/08/15 2011     Chief Complaint  Patient presents with  . Dental Pain   (Consider location/radiation/quality/duration/timing/severity/associated sxs/prior Treatment) Patient is a 30 y.o. female presenting with tooth pain. The history is provided by the patient. No language interpreter was used.  Dental Pain Associated symptoms: no congestion, no drooling and no fever    Patient presents with complaint of R upper dental pain, started yesterday around 4-5pm.  Became worse as the evenign wore on. She took ibuprofen 400mg  without relief, repeated again without relief, then took 800mg  before bedtime and was able to reduce the pain enough to get some sleep.  This morning became painful again; she noticed the pain was better after she cried and had some clear nasal drainage.  Took 1/2 of a percocet tablet without relief of the pain.   Patient denies cough, fevers or chills. Has been able to eat.  Has had some pain in the R ear as well, better now.   Social Hx; Smokes 1/2 ppd cigarettes.   Allergies: PCN, made her vomit.  She reports that she is able to take amoxicillin without a problem.   History reviewed. No pertinent past medical history. Past Surgical History  Procedure Laterality Date  . Cesarean section    . Dilation and evacuation N/A 08/11/2014    Procedure: DILATATION AND EVACUATION;  Surgeon: Juluis MireJohn S McComb, MD;  Location: [facility];  Service: Gynecology;  Laterality: N/A;   No family history on file. Social History  Substance Use Topics  . Smoking status: Current Every Day Smoker -- 0.50 packs/day    Types: Cigarettes  . Smokeless tobacco: Never Used  . Alcohol Use: No   OB History    Gravida Para Term Preterm AB TAB SAB Ectopic Multiple Living   2 1 1       1      Review of Systems  Constitutional: Negative for fever, chills, diaphoresis and fatigue.  HENT: Positive for  dental problem. Negative for congestion, drooling, postnasal drip, rhinorrhea, sneezing, sore throat and trouble swallowing.   Respiratory: Negative for cough, shortness of breath and wheezing.     Allergies  Penicillins  Home Medications   Prior to Admission medications   Medication Sig Start Date End Date Taking? Authorizing Provider  amoxicillin-clavulanate (AUGMENTIN) 875-125 MG tablet Take 1 tablet by mouth every 12 (twelve) hours. 10/08/15   Barbaraann BarthelJames O Chae Oommen, MD  cephALEXin (KEFLEX) 500 MG capsule Take 1 capsule (500 mg total) by mouth 4 (four) times daily. 02/28/15   Hayden Rasmussenavid Mabe, NP  cephALEXin (KEFLEX) 500 MG capsule Take 1 capsule (500 mg total) by mouth 4 (four) times daily. 02/28/15   Hayden Rasmussenavid Mabe, NP  ibuprofen (ADVIL,MOTRIN) 600 MG tablet Take 1 tablet (600 mg total) by mouth every 6 (six) hours as needed. 08/05/14   Misty StanleyLisa A Leftwich-Kirby, CNM  naproxen (NAPROSYN) 500 MG tablet Take 1 tablet (500 mg total) by mouth 2 (two) times daily with a meal. 10/08/15   Barbaraann BarthelJames O Ouida Abeyta, MD  oxyCODONE-acetaminophen (PERCOCET) 7.5-325 MG per tablet Take 1 tablet by mouth every 4 (four) hours as needed for pain. 08/11/14   Richardean ChimeraJohn McComb, MD  oxyCODONE-acetaminophen (PERCOCET/ROXICET) 5-325 MG per tablet Take 1-2 tablets by mouth every 6 (six) hours as needed. 08/05/14   Misty StanleyLisa A Leftwich-Kirby, CNM  promethazine (PHENERGAN) 25 MG tablet Take 0.5-1 tablets (12.5-25 mg total) by  mouth every 6 (six) hours as needed. 08/05/14   Wilmer Floor Leftwich-Kirby, CNM   Meds Ordered and Administered this Visit   Medications  ketorolac (TORADOL) injection 60 mg (not administered)    BP 118/89 mmHg  Pulse 109  Temp(Src) 98 F (36.7 C) (Oral)  Resp 20  SpO2 100%  LMP 10/04/2015 No data found.   Physical Exam  Constitutional: She appears well-developed and well-nourished. No distress.  HENT:  Head: Normocephalic and atraumatic.  Left Ear: External ear normal.  Mouth/Throat: Oropharynx is clear and moist. No oropharyngeal  exudate.  Trace erythema along rim of R TM, good cone of light. No perforation in TM.   R posterior-most molar with clear central cavity. No purulence or erythema of gum line. Moist oropharynx. Salivary ducts without erythema or tenderness.   No anterior cervical adenopathy.   No frontal or maxillary sinus tenderness.   Nasal mucosa boggy and erythematous with watery discharge.     Eyes: Conjunctivae are normal. Pupils are equal, round, and reactive to light. Right eye exhibits no discharge. Left eye exhibits no discharge.  Neck: Normal range of motion. Neck supple.  Cardiovascular: Normal rate, regular rhythm and normal heart sounds.   Pulmonary/Chest: Effort normal and breath sounds normal. No respiratory distress. She has no wheezes. She has no rales. She exhibits no tenderness.  Lymphadenopathy:    She has no cervical adenopathy.  Skin: She is not diaphoretic.    ED Course  Procedures (including critical care time)  Labs Review Labs Reviewed - No data to display  Imaging Review No results found.   Visual Acuity Review  Right Eye Distance:   Left Eye Distance:   Bilateral Distance:    Right Eye Near:   Left Eye Near:    Bilateral Near:         MDM   1. Dental abscess    Patient with apparent dental abscess, R upper posterior-most molar.  She has been able to eat and swallow.  Discussed that her definitive treatment is to see a dentist.  For now, IM Toradol  x1; Augmentin 875/125 tablets, take 1 tablet by mouth twice daily; Naproxen  twice daily starting tomorrow, as needed for dental pain. To call her dentist in the morning for evaluation.   Discussed indications for return to [facility] or Emergency Department.   Paula Compton, MD    Barbaraann Barthel, MD 10/08/15 [phone]

## 2015-10-08 NOTE — ED Notes (Signed)
C/o right side lower dental pain onset yest... Pain radiates to right ear, neck and head Denies fevers... Has been taking mother's percocet w/no relief.  A&O x4... No acute distress.

## 2015-10-08 NOTE — Discharge Instructions (Signed)
It is a pleasure to see you today.  I believe the pain you are experiencing is from a dental abscess.   You are being given an injectable pain medication, Toradol 60mg  intramuscularly in the urgent care center.   Augmentin (875/125) tablets, take 1 tablet by mouth twice daily.   Naproxen 500mg  tablets, take 1 tablet by mouth every 12 hours as needed for pain. Take first dose tomorrow.   The definitive treatment of your pain is to see your dentist.   Return to the urgent care center or the emergency department if you experience fevers/chills, worsening pain, inability to eat or swallow, swelling/pain over your face, or with other concerns.

## 2016-08-06 ENCOUNTER — Encounter (HOSPITAL_COMMUNITY): Payer: Self-pay | Admitting: *Deleted

## 2016-08-06 ENCOUNTER — Ambulatory Visit (HOSPITAL_COMMUNITY)
Admission: EM | Admit: 2016-08-06 | Discharge: 2016-08-06 | Disposition: A | Discharge status: Home / Self Care | Payer: Self-pay | Attending: Family Medicine | Admitting: Family Medicine

## 2016-08-06 DIAGNOSIS — K047 Periapical abscess without sinus: Secondary | ICD-10-CM

## 2016-08-06 DIAGNOSIS — K029 Dental caries, unspecified: Secondary | ICD-10-CM

## 2016-08-06 MED ORDER — HYDROCODONE-ACETAMINOPHEN 10-325 MG PO TABS: 1.0000 | ORAL_TABLET | Freq: Four times a day (QID) | ORAL | 0 refills | Status: AC | PRN

## 2016-08-06 MED ORDER — HYDROCODONE-ACETAMINOPHEN 10-325 MG PO TABS: 1.0000 | ORAL_TABLET | Freq: Four times a day (QID) | ORAL | 0 refills | Status: DC | PRN

## 2016-08-06 MED ORDER — CLINDAMYCIN HCL 300 MG PO CAPS: 300.0000 mg | ORAL_CAPSULE | Freq: Three times a day (TID) | ORAL | 0 refills | Status: DC

## 2016-08-06 NOTE — ED Triage Notes (Signed)
Pt  Reports  r  Side   Facial  Pain   With  Some   Swelling       Pt  Reports     Symptoms  Began  Yesterday          Pat  Reports   r  Lower  Molar     Problem     In  Past

## 2016-08-06 NOTE — ED Provider Notes (Signed)
CSN: [id number]     Arrival date & time 08/06/16  1223 History   First MD Initiated Contact with Patient 08/06/16 1343     Chief Complaint  Patient presents with  . Facial Pain   (Consider location/radiation/quality/duration/timing/severity/associated sxs/prior Treatment) 31 year old female presents to clinic with chief complaint of right sided facial pain, pain is worse when chewing, has been ongoing for past two days, She states she has a cavity in a molar on that side of her face but has been unable to afford to have it fixed.   The history is provided by the patient.    History reviewed. No pertinent past medical history. Past Surgical History:  Procedure Laterality Date  . CESAREAN SECTION    . DILATION AND EVACUATION N/A 08/11/2014   Procedure: DILATATION AND EVACUATION;  Surgeon: Juluis MireJohn S McComb, MD;  Location: [facility];  Service: Gynecology;  Laterality: N/A;   History reviewed. No pertinent family history. Social History  Substance Use Topics  . Smoking status: Current Every Day Smoker    Packs/day: 0.50    Types: Cigarettes  . Smokeless tobacco: Never Used  . Alcohol use No   OB History    Gravida Para Term Preterm AB Living   2 1 1     1    SAB TAB Ectopic Multiple Live Births                 Review of Systems  Reason unable to perform ROS: as covered in HPI.  All other systems reviewed and are negative.   Allergies  Penicillins  Home Medications   Prior to Admission medications   Medication Sig Start Date End Date Taking? Authorizing Provider  amoxicillin-clavulanate (AUGMENTIN) 875-125 MG tablet Take 1 tablet by mouth every 12 (twelve) hours. 10/08/15   Barbaraann BarthelJames O Breen, MD  cephALEXin (KEFLEX) 500 MG capsule Take 1 capsule (500 mg total) by mouth 4 (four) times daily. 02/28/15   Hayden Rasmussenavid Mabe, NP  cephALEXin (KEFLEX) 500 MG capsule Take 1 capsule (500 mg total) by mouth 4 (four) times daily. 02/28/15   Hayden Rasmussenavid Mabe, NP  clindamycin (CLEOCIN) 300 MG capsule Take 1  capsule (300 mg total) by mouth 3 (three) times daily. 08/06/16   Dorena BodoLawrence Drexler Maland, NP  HYDROcodone-acetaminophen (NORCO) 10-325 MG tablet Take 1 tablet by mouth every 6 (six) hours as needed. 08/06/16 08/11/16  Dorena BodoLawrence Smayan Hackbart, NP  ibuprofen (ADVIL,MOTRIN) 600 MG tablet Take 1 tablet (600 mg total) by mouth every 6 (six) hours as needed. 08/05/14   Misty StanleyLisa A Leftwich-Kirby, CNM  naproxen (NAPROSYN) 500 MG tablet Take 1 tablet (500 mg total) by mouth 2 (two) times daily with a meal. 10/08/15   Barbaraann BarthelJames O Breen, MD  oxyCODONE-acetaminophen (PERCOCET) 7.5-325 MG per tablet Take 1 tablet by mouth every 4 (four) hours as needed for pain. 08/11/14   Richardean ChimeraJohn McComb, MD  oxyCODONE-acetaminophen (PERCOCET/ROXICET) 5-325 MG per tablet Take 1-2 tablets by mouth every 6 (six) hours as needed. 08/05/14   Misty StanleyLisa A Leftwich-Kirby, CNM  promethazine (PHENERGAN) 25 MG tablet Take 0.5-1 tablets (12.5-25 mg total) by mouth every 6 (six) hours as needed. 08/05/14   Wilmer FloorLisa A Leftwich-Kirby, CNM   Meds Ordered and Administered this Visit  Medications - No data to display  BP 138/90 (BP Location: Right Arm)   Pulse 78   Temp 97.4 F (36.3 C) (Oral)   Resp 18   LMP 07/16/2016   SpO2 100%   Breastfeeding? No  No data found.  Physical Exam  Constitutional: She is oriented to person, place, and time. She appears well-developed and well-nourished. No distress.  HENT:  Head: Normocephalic and atraumatic.    Right Ear: External ear normal.  Left Ear: External ear normal.  Mouth/Throat: Oropharynx is clear and moist. Dental caries present.    Neck: Normal range of motion. Neck supple.  Cardiovascular: Normal rate and regular rhythm.   Pulmonary/Chest: Effort normal and breath sounds normal.  Lymphadenopathy:    She has no cervical adenopathy.  Neurological: She is alert and oriented to person, place, and time.  Skin: Skin is warm and dry. Capillary refill takes less than 2 seconds. She is not diaphoretic.  Psychiatric: She has  a normal mood and affect.  Nursing note and vitals reviewed.   Urgent Care Course     Procedures (including critical care time)  Labs Review Labs Reviewed - No data to display  Imaging Review No results found.   Visual Acuity Review  Right Eye Distance:   Left Eye Distance:   Bilateral Distance:    Right Eye Near:   Left Eye Near:    Bilateral Near:         MDM   1. Infected dental carries   You have a dental infection, I have prescribed an antibiotic called clindamycin to treat the infection. Take 1 tablet three times a day for 7 days. For pain I have prescribed a medication called hydrocodone, this is a narcotic and it is addictive.Do not take for more than 5 days, do not drink alcohol while taking and do not drive. In addition to these medicines you may take Ibuprofen, 800 mg every 8 hours for additional pain control. This is available over the counter, take 4 tablets no sooner than once every 8 hours. Do not take this much ibuprofen for more than 1 week as it may damage your kidneys.  You will need to follow up with a dentist for treatment as your symptoms will return without care.  [place] Controlled Substances Reporting System consulted prior to issuing the prescription. Pt found to have no prescriptions issued in past 6 months.     Dorena Bodo, NP 08/06/16 1402

## 2016-08-06 NOTE — Discharge Instructions (Signed)
You have a dental infection, I have prescribed an antibiotic called clindamycin to treat the infection. Take 1 tablet three times a day for 7 days. For pain I have prescribed a medication called hydrocodone, this is a narcotic and it is addictive.Do not take for more than 5 days, do not drink alcohol while taking and do not drive. In addition to these medicines you may take Ibuprofen, 800 mg every 8 hours for additional pain control. This is available over the counter, take 4 tablets no sooner than once every 8 hours. Do not take this much ibuprofen for more than 1 week as it may damage your kidneys.  You will need to follow up with a dentist for treatment as your symptoms will return without care.

## 2017-01-14 ENCOUNTER — Encounter: Payer: Self-pay | Admitting: Obstetrics and Gynecology

## 2017-01-14 ENCOUNTER — Other Ambulatory Visit (HOSPITAL_COMMUNITY)
Admission: RE | Admit: 2017-01-14 | Discharge: 2017-01-14 | Disposition: A | Discharge status: Home / Self Care | Payer: Medicaid Other | Source: Ambulatory Visit | Attending: Obstetrics and Gynecology | Admitting: Obstetrics and Gynecology

## 2017-01-14 ENCOUNTER — Ambulatory Visit (INDEPENDENT_AMBULATORY_CARE_PROVIDER_SITE_OTHER): Payer: Medicaid Other | Admitting: Obstetrics and Gynecology

## 2017-01-14 VITALS — BP 106/67 | HR 102 | Wt 131.0 lb

## 2017-01-14 DIAGNOSIS — Z8751 Personal history of pre-term labor: Secondary | ICD-10-CM | POA: Insufficient documentation

## 2017-01-14 DIAGNOSIS — O0992 Supervision of high risk pregnancy, unspecified, second trimester: Secondary | ICD-10-CM | POA: Insufficient documentation

## 2017-01-14 DIAGNOSIS — Z720 Tobacco use: Secondary | ICD-10-CM | POA: Insufficient documentation

## 2017-01-14 DIAGNOSIS — Z3482 Encounter for supervision of other normal pregnancy, second trimester: Secondary | ICD-10-CM

## 2017-01-14 DIAGNOSIS — B3749 Other urogenital candidiasis: Secondary | ICD-10-CM | POA: Diagnosis not present

## 2017-01-14 DIAGNOSIS — Z3480 Encounter for supervision of other normal pregnancy, unspecified trimester: Secondary | ICD-10-CM

## 2017-01-14 DIAGNOSIS — Z98891 History of uterine scar from previous surgery: Secondary | ICD-10-CM | POA: Insufficient documentation

## 2017-01-14 HISTORY — DX: Tobacco use: Z72.0

## 2017-01-14 NOTE — Progress Notes (Signed)
New OB Note  01/14/2017   Clinic: [facility]  Chief Complaint: NOB  Transfer of Care Patient: no  History of Present Illness: Ms. Shannon Archer is a 31 y.o. [id number] @ 16/0 weeks (EDC 12/25, based on Patient's last menstrual period was 09/24/2016.=16wk u/s).  Preg complicated by has Supervision of high risk pregnancy, antepartum, second trimester; History of cesarean delivery; History of preterm delivery; and Tobacco abuse on her problem list.   Any events prior to today's visit: no Her periods were: qmonth, regular She was using no method when she conceived.  She has Negative signs or symptoms of nausea/vomiting of pregnancy. She has Negative signs or symptoms of miscarriage or preterm labor On any different medications around the time she conceived/early pregnancy: No   ROS: A 12-point review of systems was performed and negative, except as stated in the above HPI.  OBGYN History: As per HPI. OB History  Gravida Para Term Preterm AB Living  3 1 0 1 1 1   SAB TAB Ectopic Multiple Live Births  1 0 0 0 1    # Outcome Date GA Lbr Len/2nd Weight Sex Delivery Anes PTL Lv  3 Current           2 SAB 08/12/14 [redacted]w[redacted]d    SAB     1 Preterm 07/16/09 [redacted]w[redacted]d  5 lb 10 oz (2.551 kg) F CS-LTranv   LIV    Obstetric Comments  36wks spontaneous PTB (PPROM). Previous C/S - Breech Presentation. Was never told she couldn't labor in the future. s    Any issues with any prior pregnancies: see OBHx Prior children are healthy, doing well, and without any problems or issues: yes History of pap smears: Yes. Last pap smear unsure and results were negative   Past Medical History: History reviewed. No pertinent past medical history.  Past Surgical History: Past Surgical History:  Procedure Laterality Date  . CESAREAN SECTION    . DILATION AND EVACUATION N/A 08/11/2014   Procedure: DILATATION AND EVACUATION;  Surgeon: Juluis Mire, MD;  Location: [facility];  Service: Gynecology;   Laterality: N/A;  . NOSE SURGERY      Family History:  History reviewed. No pertinent family history. She denies any history of mental retardation, birth defects or genetic disorders in her or the FOB's history. Mom with some sort of LE deformity  Social History:  Social History   Social History  . Marital status: Divorced    Spouse name: N/A  . Number of children: N/A  . Years of education: N/A   Occupational History  . Not on file.   Social History Main Topics  . Smoking status: Current Every Day Smoker    Packs/day: 0.50    Types: Cigarettes  . Smokeless tobacco: Never Used  . Alcohol use No  . Drug use: No  . Sexual activity: Yes   Other Topics Concern  . Not on file   Social History Narrative  . No narrative on file    Allergy: Allergies  Allergen Reactions  . Penicillins Nausea And Vomiting    Health Maintenance:  Mammogram Up to Date: not applicable  Current Outpatient Medications: none  Physical Exam:   BP 106/67   Pulse (!) 102   Wt 131 lb (59.4 kg)   LMP 09/24/2016   BMI 23.21 kg/m  Body mass index is 23.21 kg/m. Contractions: Not present Vag. Bleeding: None. Fundal height: not applicable FHTs: 140s  General appearance: Well nourished, well developed female  in no acute distress.  Neck:  Supple, normal appearance, and no thyromegaly  Cardiovascular: S1, S2 normal, no murmur, rub or gallop, regular rate and rhythm Respiratory:  Clear to auscultation bilateral. Normal respiratory effort Abdomen: positive bowel sounds and no masses, hernias; diffusely non tender to palpation, non distended Breasts: breasts appear normal, no suspicious masses, no skin or nipple changes or axillary nodes, normal palpation. Neuro/Psych:  Normal mood and affect.  Skin:  Warm and dry.  Lymphatic:  No inguinal lymphadenopathy.   Pelvic exam: is not limited by body habitus EGBUS: within normal limits, Vagina: within normal limits and with no blood in the vault,  Cervix: normal appearing cervix without discharge or lesions, closed/long/high, Uterus:  enlarged, c/w 16 week size, and Adnexa:  normal adnexa and no mass, fullness, tenderness  Laboratory: none  Imaging:  BSUS SLIUP at 16/0wks, normal FHR, subj normal AF  Assessment: pt stable  Plan: 1. Supervision of high risk pregnancy Routine care. Pt told to start PNV.  - Cytology - PAP - Culture, OB Urine - Cystic Fibrosis Mutation 97 - Hemoglobinopathy evaluation - Obstetric Panel, Including HIV - AFP TETRA - SMN1 Copy Number Analysis  2. History of preterm delivery Pt amenable to 17p and surveillance CLs - US MFM OB DETAIL +14 WK; Future - US OB Transvaginal; Future - US OB Transvaginal; Future  3. Tobacco abuse Benefits of cessation d/w pt  4. H/o c-section Told her she is a candidate to VBAC. Currently desires rpt c-section  Problem list reviewed and updated.  Follow up in 3 weeks.  The nature of [facility] with multiple MDs and other Advanced Practice Providers was explained to patient; also emphasized that residents, students are part of our team.  >50% of 25 min visit spent on counseling and coordination of care.     Cornelia Copaharlie Trayvion Embleton, Jr. MD Attending Center for [facility](Faculty Practice)

## 2017-01-15 ENCOUNTER — Encounter: Payer: Self-pay | Admitting: *Deleted

## 2017-01-16 LAB — CYTOLOGY - PAP
CHLAMYDIA, DNA PROBE: NEGATIVE
DIAGNOSIS: NEGATIVE
HPV (WINDOPATH): NOT DETECTED
NEISSERIA GONORRHEA: NEGATIVE
Trichomonas: NEGATIVE

## 2017-01-16 LAB — URINE CULTURE, OB REFLEX

## 2017-01-16 LAB — CULTURE, OB URINE

## 2017-01-17 ENCOUNTER — Ambulatory Visit (INDEPENDENT_AMBULATORY_CARE_PROVIDER_SITE_OTHER): Payer: Medicaid Other | Admitting: *Deleted

## 2017-01-17 DIAGNOSIS — O09892 Supervision of other high risk pregnancies, second trimester: Secondary | ICD-10-CM

## 2017-01-17 DIAGNOSIS — O09212 Supervision of pregnancy with history of pre-term labor, second trimester: Secondary | ICD-10-CM | POA: Diagnosis not present

## 2017-01-17 MED ORDER — HYDROXYPROGESTERONE CAPROATE 250 MG/ML IM OIL
250.0000 mg | TOPICAL_OIL | INTRAMUSCULAR | Status: DC
  Administered 2017-01-17 – 2017-02-20 (×5): 250 mg via INTRAMUSCULAR

## 2017-01-17 NOTE — Progress Notes (Signed)
Pt is here today to initiate 17P injection due to a history of preterm delivery.

## 2017-01-24 ENCOUNTER — Ambulatory Visit (INDEPENDENT_AMBULATORY_CARE_PROVIDER_SITE_OTHER): Payer: Medicaid Other | Admitting: *Deleted

## 2017-01-24 DIAGNOSIS — Z8751 Personal history of pre-term labor: Secondary | ICD-10-CM

## 2017-01-24 DIAGNOSIS — O09212 Supervision of pregnancy with history of pre-term labor, second trimester: Secondary | ICD-10-CM | POA: Diagnosis not present

## 2017-01-24 NOTE — Progress Notes (Signed)
Pt here for a 17p injection. Inj given in left upper outter quadrant. Pt tolerated well. Pt advised to schedule Nurse Visit for 1 week.

## 2017-01-28 ENCOUNTER — Other Ambulatory Visit: Payer: Self-pay | Admitting: Obstetrics and Gynecology

## 2017-01-28 ENCOUNTER — Encounter (HOSPITAL_COMMUNITY): Payer: Self-pay

## 2017-01-28 ENCOUNTER — Ambulatory Visit (HOSPITAL_COMMUNITY)
Admission: RE | Admit: 2017-01-28 | Discharge: 2017-01-28 | Disposition: A | Discharge status: Home / Self Care | Payer: Medicaid Other | Source: Ambulatory Visit | Attending: Obstetrics and Gynecology | Admitting: Obstetrics and Gynecology

## 2017-01-28 DIAGNOSIS — Z3A18 18 weeks gestation of pregnancy: Secondary | ICD-10-CM | POA: Insufficient documentation

## 2017-01-28 DIAGNOSIS — O99332 Smoking (tobacco) complicating pregnancy, second trimester: Secondary | ICD-10-CM | POA: Insufficient documentation

## 2017-01-28 DIAGNOSIS — O09212 Supervision of pregnancy with history of pre-term labor, second trimester: Secondary | ICD-10-CM | POA: Insufficient documentation

## 2017-01-28 DIAGNOSIS — Z8751 Personal history of pre-term labor: Secondary | ICD-10-CM

## 2017-01-28 DIAGNOSIS — O0992 Supervision of high risk pregnancy, unspecified, second trimester: Secondary | ICD-10-CM

## 2017-01-28 DIAGNOSIS — O34219 Maternal care for unspecified type scar from previous cesarean delivery: Secondary | ICD-10-CM

## 2017-01-28 DIAGNOSIS — Z363 Encounter for antenatal screening for malformations: Secondary | ICD-10-CM

## 2017-01-28 DIAGNOSIS — O34211 Maternal care for low transverse scar from previous cesarean delivery: Secondary | ICD-10-CM | POA: Insufficient documentation

## 2017-01-28 LAB — HEMOGLOBINOPATHY EVALUATION
HEMOGLOBIN A2 QUANTITATION: 2.8 % (ref 1.8–3.2)
HEMOGLOBIN F QUANTITATION: 0 % (ref 0.0–2.0)
HGB C: 0 %
HGB S: 0 %
HGB VARIANT: 0 %
Hgb A: 97.2 % (ref 96.4–98.8)

## 2017-01-28 LAB — SMN1 COPY NUMBER ANALYSIS (SMA CARRIER SCREENING)

## 2017-01-28 LAB — OBSTETRIC PANEL, INCLUDING HIV
Antibody Screen: NEGATIVE
Basophils Absolute: 0 10*3/uL (ref 0.0–0.2)
Basos: 0 %
EOS (ABSOLUTE): 0.1 10*3/uL (ref 0.0–0.4)
EOS: 1 %
HEMOGLOBIN: 12.5 g/dL (ref 11.1–15.9)
HEP B S AG: NEGATIVE
HIV Screen 4th Generation wRfx: NONREACTIVE
Hematocrit: 36.5 % (ref 34.0–46.6)
IMMATURE GRANULOCYTES: 0 %
Immature Grans (Abs): 0 10*3/uL (ref 0.0–0.1)
LYMPHS: 17 %
Lymphocytes Absolute: 2 10*3/uL (ref 0.7–3.1)
MCH: 30.1 pg (ref 26.6–33.0)
MCHC: 34.2 g/dL (ref 31.5–35.7)
MCV: 88 fL (ref 79–97)
Monocytes Absolute: 0.6 10*3/uL (ref 0.1–0.9)
Monocytes: 5 %
Neutrophils Absolute: 9.4 10*3/uL — ABNORMAL HIGH (ref 1.4–7.0)
Neutrophils: 77 %
Platelets: 279 10*3/uL (ref 150–379)
RBC: 4.15 x10E6/uL (ref 3.77–5.28)
RDW: 13.8 % (ref 12.3–15.4)
RH TYPE: POSITIVE
RPR Ser Ql: NONREACTIVE
RUBELLA: 4.39 {index} (ref >0.99)
WBC: 12.1 10*3/uL — AB (ref 3.4–10.8)

## 2017-01-28 LAB — AFP TETRA
DIA Mom Value: 2.46
DIA Value (EIA): 457.87 pg/mL
DSR (BY AGE) 1 IN: 460
DSR (Second Trimester) 1 IN: 1926
Gestational Age: 16 WEEKS
MSAFP MOM: 1.5
MSAFP: 53.7 ng/mL
MSHCG MOM: 0.58
MSHCG: 25182 m[IU]/mL
Maternal Age At EDD: 32.8 yr
OSB RISK: 2734
Test Results:: NEGATIVE
UE3 VALUE: 0.97 ng/mL
Weight: 131 [lb_av]
uE3 Mom: 1.21

## 2017-01-28 LAB — CYSTIC FIBROSIS MUTATION 97

## 2017-01-29 ENCOUNTER — Encounter: Payer: Self-pay | Admitting: Obstetrics and Gynecology

## 2017-01-29 ENCOUNTER — Other Ambulatory Visit (HOSPITAL_COMMUNITY): Payer: Self-pay | Admitting: *Deleted

## 2017-01-29 DIAGNOSIS — O09219 Supervision of pregnancy with history of pre-term labor, unspecified trimester: Secondary | ICD-10-CM

## 2017-01-29 DIAGNOSIS — Z141 Cystic fibrosis carrier: Secondary | ICD-10-CM

## 2017-01-29 DIAGNOSIS — O09892 Supervision of other high risk pregnancies, second trimester: Secondary | ICD-10-CM | POA: Insufficient documentation

## 2017-01-31 ENCOUNTER — Ambulatory Visit (INDEPENDENT_AMBULATORY_CARE_PROVIDER_SITE_OTHER): Payer: Medicaid Other | Admitting: *Deleted

## 2017-01-31 DIAGNOSIS — O09212 Supervision of pregnancy with history of pre-term labor, second trimester: Secondary | ICD-10-CM

## 2017-01-31 DIAGNOSIS — Z8751 Personal history of pre-term labor: Secondary | ICD-10-CM

## 2017-02-02 ENCOUNTER — Encounter (HOSPITAL_COMMUNITY): Payer: Self-pay | Admitting: *Deleted

## 2017-02-02 ENCOUNTER — Inpatient Hospital Stay (HOSPITAL_COMMUNITY)
Admission: AD | Admit: 2017-02-02 | Discharge: 2017-02-02 | Disposition: A | Discharge status: Home / Self Care | Payer: Medicaid Other | Source: Ambulatory Visit | Attending: Obstetrics and Gynecology | Admitting: Obstetrics and Gynecology

## 2017-02-02 DIAGNOSIS — O99332 Smoking (tobacco) complicating pregnancy, second trimester: Secondary | ICD-10-CM | POA: Insufficient documentation

## 2017-02-02 DIAGNOSIS — R109 Unspecified abdominal pain: Secondary | ICD-10-CM | POA: Insufficient documentation

## 2017-02-02 DIAGNOSIS — Z3A18 18 weeks gestation of pregnancy: Secondary | ICD-10-CM | POA: Diagnosis not present

## 2017-02-02 DIAGNOSIS — O26892 Other specified pregnancy related conditions, second trimester: Secondary | ICD-10-CM | POA: Diagnosis not present

## 2017-02-02 DIAGNOSIS — O26891 Other specified pregnancy related conditions, first trimester: Secondary | ICD-10-CM

## 2017-02-02 LAB — URINALYSIS, ROUTINE W REFLEX MICROSCOPIC
Bilirubin Urine: NEGATIVE
GLUCOSE, UA: NEGATIVE mg/dL
KETONES UR: 20 mg/dL — AB
Leukocytes, UA: NEGATIVE
NITRITE: NEGATIVE
PH: 5 (ref 5.0–8.0)
Protein, ur: NEGATIVE mg/dL
Specific Gravity, Urine: 1.018 (ref 1.005–1.030)

## 2017-02-02 NOTE — MAU Provider Note (Signed)
History    [id number] @ 18.5 wks in c/o abd pressure and pain that started on Friday. Denies vag bleeding or ROM.  CSN: [id number]  Arrival date & time 02/02/17  1546   None     Chief Complaint  Patient presents with  . Abdominal Pain    HPI  Past Medical History:  Diagnosis Date  . Medical history non-contributory     Past Surgical History:  Procedure Laterality Date  . CESAREAN SECTION    . DILATION AND EVACUATION N/A 08/11/2014   Procedure: DILATATION AND EVACUATION;  Surgeon: Juluis MireJohn S McComb, MD;  Location: [facility];  Service: Gynecology;  Laterality: N/A;  . NOSE SURGERY      No family history on file.  Social History  Substance Use Topics  . Smoking status: Current Every Day Smoker    Packs/day: 0.50    Types: Cigarettes  . Smokeless tobacco: Never Used  . Alcohol use No    OB History    Gravida Para Term Preterm AB Living   3 1 0 1 1 1    SAB TAB Ectopic Multiple Live Births   1 0 0 0 1      Obstetric Comments   36wks spontaneous PTB (PPROM). Previous C/S - Breech Presentation. Was never told she couldn't labor in the future. s      Review of Systems  Constitutional: Negative.   HENT: Negative.   Eyes: Negative.   Respiratory: Negative.   Cardiovascular: Negative.   Gastrointestinal: Positive for abdominal pain.  Endocrine: Negative.   Genitourinary: Negative.   Musculoskeletal: Negative.   Skin: Negative.   Allergic/Immunologic: Negative.   Neurological: Negative.   Hematological: Negative.   Psychiatric/Behavioral: Negative.     Allergies  Penicillins  Home Medications    LMP 09/24/2016   Physical Exam  Constitutional: She is oriented to person, place, and time. She appears well-developed and well-nourished.  HENT:  Head: Normocephalic.  Eyes: Pupils are equal, round, and reactive to light.  Neck: Normal range of motion.  Cardiovascular: Normal rate, regular rhythm, normal heart sounds and intact distal pulses.   Pulmonary/Chest: Effort  normal and breath sounds normal.  Abdominal: Soft. Bowel sounds are normal.  Genitourinary: Vagina normal and uterus normal.  Musculoskeletal: Normal range of motion.  Neurological: She is alert and oriented to person, place, and time. She has normal reflexes.  Skin: Skin is warm and dry.  Psychiatric: She has a normal mood and affect. Her behavior is normal. Judgment and thought content normal.    MAU Course  Procedures (including critical care time)  Labs Reviewed  URINALYSIS, ROUTINE W REFLEX MICROSCOPIC   No results found.   No diagnosis found.    MDM  FHR st and reg per doppler. Fundal height -2u. SVE firm/cl/th/post/high. VSS, abd soft and non tender. U/a WNL. Will d/c home in stable condition.

## 2017-02-02 NOTE — Discharge Instructions (Signed)

## 2017-02-02 NOTE — MAU Note (Signed)
Pt presents to MAU with complaints of lower abdominal cramping that started a couple of weeks ago. Denies any VB or abnormal discharge

## 2017-02-04 ENCOUNTER — Telehealth: Payer: Self-pay | Admitting: Obstetrics and Gynecology

## 2017-02-04 NOTE — Telephone Encounter (Signed)
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 Options d/w pt including testing husband, genetic counseling or just waiting to see what happens when the baby is tested as a newborn and she's fine with the latter  Shannon Archer, Jr MD Attending Center for [facility] ([facility]ulty Practice) 02/04/2017 Time: 11am

## 2017-02-06 ENCOUNTER — Encounter: Payer: Self-pay | Admitting: Obstetrics & Gynecology

## 2017-02-06 ENCOUNTER — Ambulatory Visit (INDEPENDENT_AMBULATORY_CARE_PROVIDER_SITE_OTHER): Payer: Medicaid Other | Admitting: Obstetrics & Gynecology

## 2017-02-06 VITALS — BP 100/63 | HR 90 | Wt 130.0 lb

## 2017-02-06 DIAGNOSIS — O0992 Supervision of high risk pregnancy, unspecified, second trimester: Secondary | ICD-10-CM

## 2017-02-06 DIAGNOSIS — O09212 Supervision of pregnancy with history of pre-term labor, second trimester: Secondary | ICD-10-CM | POA: Diagnosis not present

## 2017-02-06 DIAGNOSIS — O09892 Supervision of other high risk pregnancies, second trimester: Secondary | ICD-10-CM

## 2017-02-06 NOTE — Patient Instructions (Signed)
Return to clinic for any scheduled appointments or obstetric concerns, or go to MAU for evaluation  

## 2017-02-06 NOTE — Progress Notes (Signed)
   PRENATAL VISIT NOTE  Subjective:  Shannon Archer is a 31 y.o. [phone] at 1050w2d being seen today for ongoing prenatal care.  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  Patient reports no complaints.  Contractions: Not present. Vag. Bleeding: None.  Movement: Present. Denies leaking of fluid.   The following portions of the patient's history were reviewed and updated as appropriate: allergies, current medications, past family history, past medical history, past social history, past surgical history and problem list. Problem list updated.  Objective:   Vitals:   02/06/17 1031  BP: 100/63  Pulse: 90  Weight: 130 lb (59 kg)    Fetal Status: Fetal Heart Rate (bpm): 148   Movement: Present     General:  Alert, oriented and cooperative. Patient is in no acute distress.  Skin: Skin is warm and dry. No rash noted.   Cardiovascular: Normal heart rate noted  Respiratory: Normal respiratory effort, no problems with respiration noted  Abdomen: Soft, gravid, appropriate for gestational age.  Pain/Pressure: Present     Pelvic: Cervical exam deferred        Extremities: Normal range of motion.  Edema: None  Mental Status:  Normal mood and affect. Normal behavior. Normal judgment and thought content.   Assessment and Plan:  Pregnancy: [id number] at 5850w2d  1. History of preterm delivery, currently pregnant in second trimester Continue weekly 17P. Will follow up scan on 02/11/17 for cervical length; if less than 2.5 cm, will recommend vaginal progesterone with or without cerclage placement. Discussed with patient. Preterm labor precautions reviewed.  2. Supervision of high risk pregnancy, antepartum, second trimester No other complaints  or concerns.  Routine obstetric precautions reviewed. Please refer to After Visit Summary for other counseling recommendations.  Return in about 1 week (around 02/13/2017) for and 2 weeks 17P only.  3 weeks 17P and MD visit.   Jaynie CollinsUgonna Emely Fahy, MD

## 2017-02-11 ENCOUNTER — Encounter (HOSPITAL_COMMUNITY): Payer: Self-pay

## 2017-02-11 ENCOUNTER — Ambulatory Visit (HOSPITAL_COMMUNITY)
Admission: RE | Admit: 2017-02-11 | Discharge: 2017-02-11 | Disposition: A | Discharge status: Home / Self Care | Payer: Medicaid Other | Source: Ambulatory Visit | Attending: Obstetrics & Gynecology | Admitting: Obstetrics & Gynecology

## 2017-02-11 ENCOUNTER — Other Ambulatory Visit: Payer: Self-pay | Admitting: Obstetrics and Gynecology

## 2017-02-11 DIAGNOSIS — O09219 Supervision of pregnancy with history of pre-term labor, unspecified trimester: Secondary | ICD-10-CM | POA: Insufficient documentation

## 2017-02-11 DIAGNOSIS — Z3A20 20 weeks gestation of pregnancy: Secondary | ICD-10-CM | POA: Insufficient documentation

## 2017-02-11 DIAGNOSIS — O0992 Supervision of high risk pregnancy, unspecified, second trimester: Secondary | ICD-10-CM | POA: Diagnosis not present

## 2017-02-11 DIAGNOSIS — O34219 Maternal care for unspecified type scar from previous cesarean delivery: Secondary | ICD-10-CM

## 2017-02-11 DIAGNOSIS — O99332 Smoking (tobacco) complicating pregnancy, second trimester: Secondary | ICD-10-CM | POA: Insufficient documentation

## 2017-02-11 DIAGNOSIS — Z8751 Personal history of pre-term labor: Secondary | ICD-10-CM

## 2017-02-11 DIAGNOSIS — O09899 Supervision of other high risk pregnancies, unspecified trimester: Secondary | ICD-10-CM

## 2017-02-13 ENCOUNTER — Ambulatory Visit (INDEPENDENT_AMBULATORY_CARE_PROVIDER_SITE_OTHER): Payer: Medicaid Other | Admitting: *Deleted

## 2017-02-13 ENCOUNTER — Encounter: Payer: Self-pay | Admitting: *Deleted

## 2017-02-13 DIAGNOSIS — O09892 Supervision of other high risk pregnancies, second trimester: Secondary | ICD-10-CM

## 2017-02-13 DIAGNOSIS — O09212 Supervision of pregnancy with history of pre-term labor, second trimester: Secondary | ICD-10-CM | POA: Diagnosis not present

## 2017-02-20 ENCOUNTER — Ambulatory Visit (INDEPENDENT_AMBULATORY_CARE_PROVIDER_SITE_OTHER): Payer: Medicaid Other | Admitting: *Deleted

## 2017-02-20 DIAGNOSIS — O09212 Supervision of pregnancy with history of pre-term labor, second trimester: Secondary | ICD-10-CM | POA: Diagnosis not present

## 2017-02-20 DIAGNOSIS — O0992 Supervision of high risk pregnancy, unspecified, second trimester: Secondary | ICD-10-CM

## 2017-02-20 NOTE — Progress Notes (Signed)
17P injection given in left glut.  Patient tolerated well.

## 2017-02-25 ENCOUNTER — Encounter (HOSPITAL_COMMUNITY): Payer: Self-pay

## 2017-02-25 ENCOUNTER — Ambulatory Visit (HOSPITAL_COMMUNITY)
Admission: RE | Admit: 2017-02-25 | Discharge: 2017-02-25 | Disposition: A | Discharge status: Home / Self Care | Payer: Medicaid Other | Source: Ambulatory Visit | Attending: Obstetrics and Gynecology | Admitting: Obstetrics and Gynecology

## 2017-02-25 DIAGNOSIS — O09212 Supervision of pregnancy with history of pre-term labor, second trimester: Secondary | ICD-10-CM | POA: Insufficient documentation

## 2017-02-25 DIAGNOSIS — O99332 Smoking (tobacco) complicating pregnancy, second trimester: Secondary | ICD-10-CM | POA: Diagnosis not present

## 2017-02-25 DIAGNOSIS — O09219 Supervision of pregnancy with history of pre-term labor, unspecified trimester: Secondary | ICD-10-CM

## 2017-02-25 DIAGNOSIS — O0992 Supervision of high risk pregnancy, unspecified, second trimester: Secondary | ICD-10-CM

## 2017-02-25 DIAGNOSIS — Z3A22 22 weeks gestation of pregnancy: Secondary | ICD-10-CM | POA: Diagnosis not present

## 2017-02-27 ENCOUNTER — Ambulatory Visit (INDEPENDENT_AMBULATORY_CARE_PROVIDER_SITE_OTHER): Payer: Medicaid Other | Admitting: Obstetrics and Gynecology

## 2017-02-27 VITALS — BP 105/69 | HR 98 | Wt 132.0 lb

## 2017-02-27 DIAGNOSIS — Z8751 Personal history of pre-term labor: Secondary | ICD-10-CM

## 2017-02-27 DIAGNOSIS — Z98891 History of uterine scar from previous surgery: Secondary | ICD-10-CM

## 2017-02-27 DIAGNOSIS — O0992 Supervision of high risk pregnancy, unspecified, second trimester: Secondary | ICD-10-CM

## 2017-02-27 DIAGNOSIS — O09212 Supervision of pregnancy with history of pre-term labor, second trimester: Secondary | ICD-10-CM

## 2017-02-27 NOTE — Progress Notes (Signed)
   PRENATAL VISIT NOTE  Subjective:  Shannon Archer is a 31 y.o. [phone] at [redacted]w[redacted]d being seen today for ongoing prenatal care.  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  Patient reports no complaints.  Contractions: Not present. Vag. Bleeding: None.  Movement: Present. Denies leaking of fluid.   The following portions of the patient's history were reviewed and updated as appropriate: allergies, current medications, past family history, past medical history, past social history, past surgical history and problem list. Problem list updated.  Objective:   Vitals:   02/27/17 1040  BP: 105/69  Pulse: 98  Weight: 132 lb (59.9 kg)    Fetal Status: Fetal Heart Rate (bpm): 145 Fundal Height: 22 cm Movement: Present     General:  Alert, oriented and cooperative. Patient is in no acute distress.  Skin: Skin is warm and dry. No rash noted.   Cardiovascular: Normal heart rate noted  Respiratory: Normal respiratory effort, no problems with respiration noted  Abdomen: Soft, gravid, appropriate for gestational age.  Pain/Pressure: Absent     Pelvic: Cervical exam deferred        Extremities: Normal range of motion.  Edema: None  Mental Status:  Normal mood and affect. Normal behavior. Normal judgment and thought content.   Assessment and Plan:  Pregnancy: G3P0111 at [redacted]w[redacted]d  1. History of cesarean delivery Desires repeat with BTL Will have her sign the consent at her next visit  2. History of preterm delivery Continue weekly 17-P 8/21 CL 3.3 cm  3. Supervision of high risk pregnancy, antepartum, second trimester Patient is doing well Discussed OTC medications safe to take during cold/flu season Anatomy ultrasound results  reviewed  Preterm labor symptoms and general obstetric precautions including but not limited to vaginal bleeding, contractions, leaking of fluid and fetal movement were reviewed in detail with the patient. Please refer to After Visit Summary for other counseling recommendations.  Return in about 4 weeks (around 03/27/2017) for ROB, 2 hr glucola next visit, weekly for 17-P.   Catalina Antigua, MD

## 2017-02-27 NOTE — Progress Notes (Signed)
Does have a chest cold.

## 2017-03-06 ENCOUNTER — Ambulatory Visit (INDEPENDENT_AMBULATORY_CARE_PROVIDER_SITE_OTHER): Payer: Medicaid Other | Admitting: *Deleted

## 2017-03-06 DIAGNOSIS — Z8751 Personal history of pre-term labor: Secondary | ICD-10-CM

## 2017-03-06 DIAGNOSIS — O09212 Supervision of pregnancy with history of pre-term labor, second trimester: Secondary | ICD-10-CM

## 2017-03-06 MED ORDER — HYDROXYPROGESTERONE CAPROATE 250 MG/ML IM OIL
250.0000 mg | TOPICAL_OIL | INTRAMUSCULAR | Status: AC
  Administered 2017-03-06 – 2017-04-24 (×5): 250 mg via INTRAMUSCULAR

## 2017-03-13 ENCOUNTER — Ambulatory Visit: Payer: Medicaid Other | Admitting: *Deleted

## 2017-03-13 ENCOUNTER — Encounter: Payer: Medicaid Other | Admitting: Obstetrics & Gynecology

## 2017-03-20 ENCOUNTER — Ambulatory Visit: Payer: Medicaid Other

## 2017-03-21 ENCOUNTER — Ambulatory Visit (INDEPENDENT_AMBULATORY_CARE_PROVIDER_SITE_OTHER): Payer: Medicaid Other | Admitting: *Deleted

## 2017-03-21 DIAGNOSIS — O09212 Supervision of pregnancy with history of pre-term labor, second trimester: Secondary | ICD-10-CM | POA: Diagnosis not present

## 2017-03-21 DIAGNOSIS — Z8751 Personal history of pre-term labor: Secondary | ICD-10-CM

## 2017-03-27 ENCOUNTER — Ambulatory Visit (INDEPENDENT_AMBULATORY_CARE_PROVIDER_SITE_OTHER): Payer: Medicaid Other | Admitting: *Deleted

## 2017-03-27 DIAGNOSIS — Z8751 Personal history of pre-term labor: Secondary | ICD-10-CM

## 2017-03-27 DIAGNOSIS — O09212 Supervision of pregnancy with history of pre-term labor, second trimester: Secondary | ICD-10-CM | POA: Diagnosis not present

## 2017-04-04 ENCOUNTER — Ambulatory Visit (INDEPENDENT_AMBULATORY_CARE_PROVIDER_SITE_OTHER): Payer: Medicaid Other | Admitting: Family Medicine

## 2017-04-04 ENCOUNTER — Encounter: Payer: Self-pay | Admitting: *Deleted

## 2017-04-04 VITALS — BP 114/71 | HR 108 | Wt 134.0 lb

## 2017-04-04 DIAGNOSIS — O0992 Supervision of high risk pregnancy, unspecified, second trimester: Secondary | ICD-10-CM

## 2017-04-04 DIAGNOSIS — O09212 Supervision of pregnancy with history of pre-term labor, second trimester: Secondary | ICD-10-CM | POA: Diagnosis not present

## 2017-04-04 DIAGNOSIS — Z8751 Personal history of pre-term labor: Secondary | ICD-10-CM

## 2017-04-04 DIAGNOSIS — Z98891 History of uterine scar from previous surgery: Secondary | ICD-10-CM

## 2017-04-04 DIAGNOSIS — O34219 Maternal care for unspecified type scar from previous cesarean delivery: Secondary | ICD-10-CM

## 2017-04-04 DIAGNOSIS — Z3490 Encounter for supervision of normal pregnancy, unspecified, unspecified trimester: Secondary | ICD-10-CM

## 2017-04-04 NOTE — Progress Notes (Signed)
   PRENATAL VISIT NOTE  Subjective:  Shannon Archer is a 31 y.o. [phone] at [redacted]w[redacted]d being seen today for ongoing prenatal care.  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  Patient reports no complaints.  Contractions: Not present. Vag. Bleeding: None.  Movement: Present. Denies leaking of fluid.   The following portions of the patient's history were reviewed and updated as appropriate: allergies, current medications, past family history, past medical history, past social history, past surgical history and problem list. Problem list updated.  Objective:   Vitals:   04/04/17 0849  BP: 114/71  Pulse: (!) 108  Weight: 134 lb (60.8 kg)    Fetal Status: Fetal Heart Rate (bpm): 150 Fundal Height: 27 cm Movement: Present     General:  Alert, oriented and cooperative. Patient is in no acute distress.  Skin: Skin is warm and dry. No rash noted.   Cardiovascular: Normal heart rate noted  Respiratory: Normal respiratory effort, no problems with respiration noted  Abdomen: Soft, gravid, appropriate for gestational age.  Pain/Pressure: Absent     Pelvic: Cervical exam deferred        Extremities: Normal range of motion.  Edema: None  Mental Status:  Normal mood and affect. Normal behavior. Normal judgment and thought content.   Assessment and Plan:  Pregnancy: [id number] at [redacted]w[redacted]d  1. Encounter for supervision of normal pregnancy, antepartum, unspecified gravidity 28 wk labs today - CBC - RPR - HIV antibody (with reflex) - Tdap vaccine greater than or equal to 7yo IM - Glucose Tolerance, 2 Hours w/1 Hour  2. History of cesarean delivery Booked for RCS and BTL-papers signed today  3. History of preterm delivery Continue 17 P  weekly  Preterm labor symptoms and general obstetric precautions including but not limited to vaginal bleeding, contractions, leaking of fluid and fetal movement were reviewed in detail with the patient. Please refer to After Visit Summary for other counseling recommendations.  Return in 2 weeks (on 04/18/2017).   Reva Bores, MD

## 2017-04-04 NOTE — Patient Instructions (Signed)
 Third Trimester of Pregnancy The third trimester is from week 28 through week 40 (months 7 through 9). The third trimester is a time when the unborn baby (fetus) is growing rapidly. At the end of the ninth month, the fetus is about 20 inches in length and weighs 6-10 pounds. Body changes during your third trimester Your body will continue to go through many changes during pregnancy. The changes vary from woman to woman. During the third trimester:  Your weight will continue to increase. You can expect to gain 25-35 pounds (11-16 kg) by the end of the pregnancy.  You may begin to get stretch marks on your hips, abdomen, and breasts.  You may urinate more often because the fetus is moving lower into your pelvis and pressing on your bladder.  You may develop or continue to have heartburn. This is caused by increased hormones that slow down muscles in the digestive tract.  You may develop or continue to have constipation because increased hormones slow digestion and cause the muscles that push waste through your intestines to relax.  You may develop hemorrhoids. These are swollen veins (varicose veins) in the rectum that can itch or be painful.  You may develop swollen, bulging veins (varicose veins) in your legs.  You may have increased body aches in the pelvis, back, or thighs. This is due to weight gain and increased hormones that are relaxing your joints.  You may have changes in your hair. These can include thickening of your hair, rapid growth, and changes in texture. Some women also have hair loss during or after pregnancy, or hair that feels dry or thin. Your hair will most likely return to normal after your baby is born.  Your breasts will continue to grow and they will continue to become tender. A yellow fluid (colostrum) may leak from your breasts. This is the first milk you are producing for your baby.  Your belly button may stick out.  You may notice more swelling in your  hands, face, or ankles.  You may have increased tingling or numbness in your hands, arms, and legs. The skin on your belly may also feel numb.  You may feel short of breath because of your expanding uterus.  You may have more problems sleeping. This can be caused by the size of your belly, increased need to urinate, and an increase in your body's metabolism.  You may notice the fetus "dropping," or moving lower in your abdomen (lightening).  You may have increased vaginal discharge.  You may notice your joints feel loose and you may have pain around your pelvic bone.  What to expect at prenatal visits You will have prenatal exams every 2 weeks until week 36. Then you will have weekly prenatal exams. During a routine prenatal visit:  You will be weighed to make sure you and the baby are growing normally.  Your blood pressure will be taken.  Your abdomen will be measured to track your baby's growth.  The fetal heartbeat will be listened to.  Any test results from the previous visit will be discussed.  You may have a cervical check near your due date to see if your cervix has softened or thinned (effaced).  You will be tested for Group B streptococcus. This happens between 35 and 37 weeks.  Your health care provider may ask you:  What your birth plan is.  How you are feeling.  If you are feeling the baby move.  If you have   had any abnormal symptoms, such as leaking fluid, bleeding, severe headaches, or abdominal cramping.  If you are using any tobacco products, including cigarettes, chewing tobacco, and electronic cigarettes.  If you have any questions.  Other tests or screenings that may be performed during your third trimester include:  Blood tests that check for low iron levels (anemia).  Fetal testing to check the health, activity level, and growth of the fetus. Testing is done if you have certain medical conditions or if there are problems during the  pregnancy.  Nonstress test (NST). This test checks the health of your baby to make sure there are no signs of problems, such as the baby not getting enough oxygen. During this test, a belt is placed around your belly. The baby is made to move, and its heart rate is monitored during movement.  What is false labor? False labor is a condition in which you feel small, irregular tightenings of the muscles in the womb (contractions) that usually go away with rest, changing position, or drinking water. These are called Braxton Hicks contractions. Contractions may last for hours, days, or even weeks before true labor sets in. If contractions come at regular intervals, become more frequent, increase in intensity, or become painful, you should see your health care provider. What are the signs of labor?  Abdominal cramps.  Regular contractions that start at 10 minutes apart and become stronger and more frequent with time.  Contractions that start on the top of the uterus and spread down to the lower abdomen and back.  Increased pelvic pressure and dull back pain.  A watery or bloody mucus discharge that comes from the vagina.  Leaking of amniotic fluid. This is also known as your "water breaking." It could be a slow trickle or a gush. Let your health care provider know if it has a color or strange odor. If you have any of these signs, call your health care provider right away, even if it is before your due date. Follow these instructions at home: Medicines  Follow your health care provider's instructions regarding medicine use. Specific medicines may be either safe or unsafe to take during pregnancy.  Take a prenatal vitamin that contains at least 600 micrograms (mcg) of folic acid.  If you develop constipation, try taking a stool softener if your health care provider approves. Eating and drinking  Eat a balanced diet that includes fresh fruits and vegetables, whole grains, good sources of protein  such as meat, eggs, or tofu, and low-fat dairy. Your health care provider will help you determine the amount of weight gain that is right for you.  Avoid raw meat and uncooked cheese. These carry germs that can cause birth defects in the baby.  If you have low calcium intake from food, talk to your health care provider about whether you should take a daily calcium supplement.  Eat four or five small meals rather than three large meals a day.  Limit foods that are high in fat and processed sugars, such as fried and sweet foods.  To prevent constipation: ? Drink enough fluid to keep your urine clear or pale yellow. ? Eat foods that are high in fiber, such as fresh fruits and vegetables, whole grains, and beans. Activity  Exercise only as directed by your health care provider. Most women can continue their usual exercise routine during pregnancy. Try to exercise for 30 minutes at least 5 days a week. Stop exercising if you experience uterine contractions.  Avoid   heavy lifting.  Do not exercise in extreme heat or humidity, or at high altitudes.  Wear low-heel, comfortable shoes.  Practice good posture.  You may continue to have sex unless your health care provider tells you otherwise. Relieving pain and discomfort  Take frequent breaks and rest with your legs elevated if you have leg cramps or low back pain.  Take warm sitz baths to soothe any pain or discomfort caused by hemorrhoids. Use hemorrhoid cream if your health care provider approves.  Wear a good support bra to prevent discomfort from breast tenderness.  If you develop varicose veins: ? Wear support pantyhose or compression stockings as told by your healthcare provider. ? Elevate your feet for 15 minutes, 3-4 times a day. Prenatal care  Write down your questions. Take them to your prenatal visits.  Keep all your prenatal visits as told by your health care provider. This is important. Safety  Wear your seat belt at  all times when driving.  Make a list of emergency phone numbers, including numbers for family, friends, the hospital, and police and fire departments. General instructions  Avoid cat litter boxes and soil used by cats. These carry germs that can cause birth defects in the baby. If you have a cat, ask someone to clean the litter box for you.  Do not travel far distances unless it is absolutely necessary and only with the approval of your health care provider.  Do not use hot tubs, steam rooms, or saunas.  Do not drink alcohol.  Do not use any products that contain nicotine or tobacco, such as cigarettes and e-cigarettes. If you need help quitting, ask your health care provider.  Do not use any medicinal herbs or unprescribed drugs. These chemicals affect the formation and growth of the baby.  Do not douche or use tampons or scented sanitary pads.  Do not cross your legs for long periods of time.  To prepare for the arrival of your baby: ? Take prenatal classes to understand, practice, and ask questions about labor and delivery. ? Make a trial run to the hospital. ? Visit the hospital and tour the maternity area. ? Arrange for maternity or paternity leave through employers. ? Arrange for family and friends to take care of pets while you are in the hospital. ? Purchase a rear-facing car seat and make sure you know how to install it in your car. ? Pack your hospital bag. ? Prepare the baby's nursery. Make sure to remove all pillows and stuffed animals from the baby's crib to prevent suffocation.  Visit your dentist if you have not gone during your pregnancy. Use a soft toothbrush to brush your teeth and be gentle when you floss. Contact a health care provider if:  You are unsure if you are in labor or if your water has broken.  You become dizzy.  You have mild pelvic cramps, pelvic pressure, or nagging pain in your abdominal area.  You have lower back pain.  You have persistent  nausea, vomiting, or diarrhea.  You have an unusual or bad smelling vaginal discharge.  You have pain when you urinate. Get help right away if:  Your water breaks before 37 weeks.  You have regular contractions less than 5 minutes apart before 37 weeks.  You have a fever.  You are leaking fluid from your vagina.  You have spotting or bleeding from your vagina.  You have severe abdominal pain or cramping.  You have rapid weight loss or weight   gain.  You have shortness of breath with chest pain.  You notice sudden or extreme swelling of your face, hands, ankles, feet, or legs.  Your baby makes fewer than 10 movements in 2 hours.  You have severe headaches that do not go away when you take medicine.  You have vision changes. Summary  The third trimester is from week 28 through week 40, months 7 through 9. The third trimester is a time when the unborn baby (fetus) is growing rapidly.  During the third trimester, your discomfort may increase as you and your baby continue to gain weight. You may have abdominal, leg, and back pain, sleeping problems, and an increased need to urinate.  During the third trimester your breasts will keep growing and they will continue to become tender. A yellow fluid (colostrum) may leak from your breasts. This is the first milk you are producing for your baby.  False labor is a condition in which you feel small, irregular tightenings of the muscles in the womb (contractions) that eventually go away. These are called Braxton Hicks contractions. Contractions may last for hours, days, or even weeks before true labor sets in.  Signs of labor can include: abdominal cramps; regular contractions that start at 10 minutes apart and become stronger and more frequent with time; watery or bloody mucus discharge that comes from the vagina; increased pelvic pressure and dull back pain; and leaking of amniotic fluid. This information is not intended to replace advice  given to you by your health care provider. Make sure you discuss any questions you have with your health care provider. Document Released: 06/18/2001 Document Revised: 11/30/2015 Document Reviewed: 08/25/2012 [employer] Interactive Patient Education  2017 [employer]   Breastfeeding Deciding to breastfeed is one of the best choices you can make for you and your baby. A change in hormones during pregnancy causes your breast tissue to grow and increases the number and size of your milk ducts. These hormones also allow proteins, sugars, and fats from your blood supply to make breast milk in your milk-producing glands. Hormones prevent breast milk from being released before your baby is born as well as prompt milk flow after birth. Once breastfeeding has begun, thoughts of your baby, as well as his or her sucking or crying, can stimulate the release of milk from your milk-producing glands. Benefits of breastfeeding For Your Baby  Your first milk (colostrum) helps your baby's digestive system function better.  There are antibodies in your milk that help your baby fight off infections.  Your baby has a lower incidence of asthma, allergies, and sudden infant death syndrome.  The nutrients in breast milk are better for your baby than infant formulas and are designed uniquely for your baby's needs.  Breast milk improves your baby's brain development.  Your baby is less likely to develop other conditions, such as childhood obesity, asthma, or type 2 diabetes mellitus.  For You  Breastfeeding helps to create a very special bond between you and your baby.  Breastfeeding is convenient. Breast milk is always available at the correct temperature and costs nothing.  Breastfeeding helps to burn calories and helps you lose the weight gained during pregnancy.  Breastfeeding makes your uterus contract to its prepregnancy size faster and slows bleeding (lochia) after you give birth.  Breastfeeding helps  to lower your risk of developing type 2 diabetes mellitus, osteoporosis, and breast or ovarian cancer later in life.  Signs that your baby is hungry Early Signs of Hunger    Increased alertness or activity.  Stretching.  Movement of the head from side to side.  Movement of the head and opening of the mouth when the corner of the mouth or cheek is stroked (rooting).  Increased sucking sounds, smacking lips, cooing, sighing, or squeaking.  Hand-to-mouth movements.  Increased sucking of fingers or hands.  Late Signs of Hunger  Fussing.  Intermittent crying.  Extreme Signs of Hunger Signs of extreme hunger will require calming and consoling before your baby will be able to breastfeed successfully. Do not wait for the following signs of extreme hunger to occur before you initiate breastfeeding:  Restlessness.  A loud, strong cry.  Screaming.  Breastfeeding basics Breastfeeding Initiation  Find a comfortable place to sit or lie down, with your neck and back well supported.  Place a pillow or rolled up blanket under your baby to bring him or her to the level of your breast (if you are seated). Nursing pillows are specially designed to help support your arms and your baby while you breastfeed.  Make sure that your baby's abdomen is facing your abdomen.  Gently massage your breast. With your fingertips, massage from your chest wall toward your nipple in a circular motion. This encourages milk flow. You may need to continue this action during the feeding if your milk flows slowly.  Support your breast with 4 fingers underneath and your thumb above your nipple. Make sure your fingers are well away from your nipple and your baby's mouth.  Stroke your baby's lips gently with your finger or nipple.  When your baby's mouth is open wide enough, quickly bring your baby to your breast, placing your entire nipple and as much of the colored area around your nipple (areola) as possible into  your baby's mouth. ? More areola should be visible above your baby's upper lip than below the lower lip. ? Your baby's tongue should be between his or her lower gum and your breast.  Ensure that your baby's mouth is correctly positioned around your nipple (latched). Your baby's lips should create a seal on your breast and be turned out (everted).  It is common for your baby to suck about 2-3 minutes in order to start the flow of breast milk.  Latching Teaching your baby how to latch on to your breast properly is very important. An improper latch can cause nipple pain and decreased milk supply for you and poor weight gain in your baby. Also, if your baby is not latched onto your nipple properly, he or she may swallow some air during feeding. This can make your baby fussy. Burping your baby when you switch breasts during the feeding can help to get rid of the air. However, teaching your baby to latch on properly is still the best way to prevent fussiness from swallowing air while breastfeeding. Signs that your baby has successfully latched on to your nipple:  Silent tugging or silent sucking, without causing you pain.  Swallowing heard between every 3-4 sucks.  Muscle movement above and in front of his or her ears while sucking.  Signs that your baby has not successfully latched on to nipple:  Sucking sounds or smacking sounds from your baby while breastfeeding.  Nipple pain.  If you think your baby has not latched on correctly, slip your finger into the corner of your baby's mouth to break the suction and place it between your baby's gums. Attempt breastfeeding initiation again. Signs of Successful Breastfeeding Signs from your baby:  A   gradual decrease in the number of sucks or complete cessation of sucking.  Falling asleep.  Relaxation of his or her body.  Retention of a small amount of milk in his or her mouth.  Letting go of your breast by himself or herself.  Signs from  you:  Breasts that have increased in firmness, weight, and size 1-3 hours after feeding.  Breasts that are softer immediately after breastfeeding.  Increased milk volume, as well as a change in milk consistency and color by the fifth day of breastfeeding.  Nipples that are not sore, cracked, or bleeding.  Signs That Your Baby is Getting Enough Milk  Wetting at least 1-2 diapers during the first 24 hours after birth.  Wetting at least 5-6 diapers every 24 hours for the first week after birth. The urine should be clear or pale yellow by 5 days after birth.  Wetting 6-8 diapers every 24 hours as your baby continues to grow and develop.  At least 3 stools in a 24-hour period by age 5 days. The stool should be soft and yellow.  At least 3 stools in a 24-hour period by age 7 days. The stool should be seedy and yellow.  No loss of weight greater than 10% of birth weight during the first 3 days of age.  Average weight gain of 4-7 ounces (113-198 g) per week after age 4 days.  Consistent daily weight gain by age 5 days, without weight loss after the age of 2 weeks.  After a feeding, your baby may spit up a small amount. This is common. Breastfeeding frequency and duration Frequent feeding will help you make more milk and can prevent sore nipples and breast engorgement. Breastfeed when you feel the need to reduce the fullness of your breasts or when your baby shows signs of hunger. This is called "breastfeeding on demand." Avoid introducing a pacifier to your baby while you are working to establish breastfeeding (the first 4-6 weeks after your baby is born). After this time you may choose to use a pacifier. Research has shown that pacifier use during the first year of a baby's life decreases the risk of sudden infant death syndrome (SIDS). Allow your baby to feed on each breast as long as he or she wants. Breastfeed until your baby is finished feeding. When your baby unlatches or falls asleep  while feeding from the first breast, offer the second breast. Because newborns are often sleepy in the first few weeks of life, you may need to awaken your baby to get him or her to feed. Breastfeeding times will vary from baby to baby. However, the following rules can serve as a guide to help you ensure that your baby is properly fed:  Newborns (babies 4 weeks of age or younger) may breastfeed every 1-3 hours.  Newborns should not go longer than 3 hours during the day or 5 hours during the night without breastfeeding.  You should breastfeed your baby a minimum of 8 times in a 24-hour period until you begin to introduce solid foods to your baby at around 6 months of age.  Breast milk pumping Pumping and storing breast milk allows you to ensure that your baby is exclusively fed your breast milk, even at times when you are unable to breastfeed. This is especially important if you are going back to work while you are still breastfeeding or when you are not able to be present during feedings. Your lactation consultant can give you guidelines on how   long it is safe to store breast milk. A breast pump is a machine that allows you to pump milk from your breast into a sterile bottle. The pumped breast milk can then be stored in a refrigerator or freezer. Some breast pumps are operated by hand, while others use electricity. Ask your lactation consultant which type will work best for you. Breast pumps can be purchased, but some hospitals and breastfeeding support groups lease breast pumps on a monthly basis. A lactation consultant can teach you how to hand express breast milk, if you prefer not to use a pump. Caring for your breasts while you breastfeed Nipples can become dry, cracked, and sore while breastfeeding. The following recommendations can help keep your breasts moisturized and healthy:  Avoid using soap on your nipples.  Wear a supportive bra. Although not required, special nursing bras and tank  tops are designed to allow access to your breasts for breastfeeding without taking off your entire bra or top. Avoid wearing underwire-style bras or extremely tight bras.  Air dry your nipples for 3-4minutes after each feeding.  Use only cotton bra pads to absorb leaked breast milk. Leaking of breast milk between feedings is normal.  Use lanolin on your nipples after breastfeeding. Lanolin helps to maintain your skin's normal moisture barrier. If you use pure lanolin, you do not need to wash it off before feeding your baby again. Pure lanolin is not toxic to your baby. You may also hand express a few drops of breast milk and gently massage that milk into your nipples and allow the milk to air dry.  In the first few weeks after giving birth, some women experience extremely full breasts (engorgement). Engorgement can make your breasts feel heavy, warm, and tender to the touch. Engorgement peaks within 3-5 days after you give birth. The following recommendations can help ease engorgement:  Completely empty your breasts while breastfeeding or pumping. You may want to start by applying warm, moist heat (in the shower or with warm water-soaked hand towels) just before feeding or pumping. This increases circulation and helps the milk flow. If your baby does not completely empty your breasts while breastfeeding, pump any extra milk after he or she is finished.  Wear a snug bra (nursing or regular) or tank top for 1-2 days to signal your body to slightly decrease milk production.  Apply ice packs to your breasts, unless this is too uncomfortable for you.  Make sure that your baby is latched on and positioned properly while breastfeeding.  If engorgement persists after 48 hours of following these recommendations, contact your health care provider or a lactation consultant. Overall health care recommendations while breastfeeding  Eat healthy foods. Alternate between meals and snacks, eating 3 of each per  day. Because what you eat affects your breast milk, some of the foods may make your baby more irritable than usual. Avoid eating these foods if you are sure that they are negatively affecting your baby.  Drink milk, fruit juice, and water to satisfy your thirst (about 10 glasses a day).  Rest often, relax, and continue to take your prenatal vitamins to prevent fatigue, stress, and anemia.  Continue breast self-awareness checks.  Avoid chewing and smoking tobacco. Chemicals from cigarettes that pass into breast milk and exposure to secondhand smoke may harm your baby.  Avoid alcohol and drug use, including marijuana. Some medicines that may be harmful to your baby can pass through breast milk. It is important to ask your health care   provider before taking any medicine, including all over-the-counter and prescription medicine as well as vitamin and herbal supplements. It is possible to become pregnant while breastfeeding. If birth control is desired, ask your health care provider about options that will be safe for your baby. Contact a health care provider if:  You feel like you want to stop breastfeeding or have become frustrated with breastfeeding.  You have painful breasts or nipples.  Your nipples are cracked or bleeding.  Your breasts are red, tender, or warm.  You have a swollen area on either breast.  You have a fever or chills.  You have nausea or vomiting.  You have drainage other than breast milk from your nipples.  Your breasts do not become full before feedings by the fifth day after you give birth.  You feel sad and depressed.  Your baby is too sleepy to eat well.  Your baby is having trouble sleeping.  Your baby is wetting less than 3 diapers in a 24-hour period.  Your baby has less than 3 stools in a 24-hour period.  Your baby's skin or the white part of his or her eyes becomes yellow.  Your baby is not gaining weight by 5 days of age. Get help right away  if:  Your baby is overly tired (lethargic) and does not want to wake up and feed.  Your baby develops an unexplained fever. This information is not intended to replace advice given to you by your health care provider. Make sure you discuss any questions you have with your health care provider. Document Released: 06/24/2005 Document Revised: 12/06/2015 Document Reviewed: 12/16/2012 Elsevier Interactive Patient Education  2017 Elsevier Inc.  

## 2017-04-05 LAB — CBC
HEMATOCRIT: 35.5 % (ref 34.0–46.6)
HEMOGLOBIN: 11.9 g/dL (ref 11.1–15.9)
MCH: 30.4 pg (ref 26.6–33.0)
MCHC: 33.5 g/dL (ref 31.5–35.7)
MCV: 91 fL (ref 79–97)
Platelets: 255 10*3/uL (ref 150–379)
RBC: 3.91 x10E6/uL (ref 3.77–5.28)
RDW: 14 % (ref 12.3–15.4)
WBC: 11.9 10*3/uL — AB (ref 3.4–10.8)

## 2017-04-05 LAB — HIV ANTIBODY (ROUTINE TESTING W REFLEX): HIV Screen 4th Generation wRfx: NONREACTIVE

## 2017-04-05 LAB — GLUCOSE TOLERANCE, 2 HOURS W/ 1HR
GLUCOSE, 1 HOUR: 180 mg/dL — AB (ref 65–179)
GLUCOSE, 2 HOUR: 84 mg/dL (ref 65–152)
Glucose, Fasting: 90 mg/dL (ref 65–91)

## 2017-04-05 LAB — RPR: RPR Ser Ql: NONREACTIVE

## 2017-04-06 ENCOUNTER — Encounter: Payer: Self-pay | Admitting: Family Medicine

## 2017-04-06 DIAGNOSIS — O24410 Gestational diabetes mellitus in pregnancy, diet controlled: Secondary | ICD-10-CM | POA: Insufficient documentation

## 2017-04-07 ENCOUNTER — Telehealth: Payer: Self-pay

## 2017-04-07 DIAGNOSIS — O24419 Gestational diabetes mellitus in pregnancy, unspecified control: Secondary | ICD-10-CM

## 2017-04-07 MED ORDER — ACCU-CHEK FASTCLIX LANCETS MISC: 1.0000 | Freq: Four times a day (QID) | 12 refills | Status: DC

## 2017-04-07 MED ORDER — ACCU-CHEK AVIVA PLUS W/DEVICE KIT: 1.0000 | PACK | Freq: Once | 0 refills | Status: AC

## 2017-04-07 MED ORDER — GLUCOSE BLOOD VI STRP: ORAL_STRIP | 12 refills | Status: DC

## 2017-04-07 NOTE — Telephone Encounter (Signed)
Patient blood results came back positive for GDM. A referral has been placed in epic for nutrition and diabetes. Lancets,test strips and meter has been called into patient pharmacy. Patient has been informed of blood results and diagnosis. Patient added to baby scripts.

## 2017-04-10 ENCOUNTER — Ambulatory Visit (INDEPENDENT_AMBULATORY_CARE_PROVIDER_SITE_OTHER): Payer: Medicaid Other

## 2017-04-10 DIAGNOSIS — Z8751 Personal history of pre-term labor: Secondary | ICD-10-CM

## 2017-04-10 DIAGNOSIS — O09213 Supervision of pregnancy with history of pre-term labor, third trimester: Secondary | ICD-10-CM

## 2017-04-10 MED ORDER — HYDROXYPROGESTERONE CAPROATE 250 MG/ML IM OIL: 250.0000 mg | TOPICAL_OIL | Freq: Once | INTRAMUSCULAR | Status: DC

## 2017-04-10 MED ORDER — HYDROXYPROGESTERONE CAPROATE 250 MG/ML IM OIL
250.0000 mg | TOPICAL_OIL | Freq: Once | INTRAMUSCULAR | Status: AC
  Administered 2017-04-10: 250 mg via INTRAMUSCULAR

## 2017-04-10 NOTE — Progress Notes (Signed)
Pt here for 17p. Inj given in right upper quad. Pt tolerated well.

## 2017-04-14 ENCOUNTER — Encounter (HOSPITAL_COMMUNITY): Payer: Self-pay

## 2017-04-16 ENCOUNTER — Encounter: Payer: Self-pay | Admitting: Registered"

## 2017-04-16 ENCOUNTER — Encounter: Payer: Medicaid Other | Attending: Family Medicine | Admitting: Registered"

## 2017-04-16 DIAGNOSIS — Z6823 Body mass index (BMI) 23.0-23.9, adult: Secondary | ICD-10-CM | POA: Diagnosis not present

## 2017-04-16 DIAGNOSIS — Z3A00 Weeks of gestation of pregnancy not specified: Secondary | ICD-10-CM | POA: Insufficient documentation

## 2017-04-16 DIAGNOSIS — R7309 Other abnormal glucose: Secondary | ICD-10-CM

## 2017-04-16 DIAGNOSIS — O24419 Gestational diabetes mellitus in pregnancy, unspecified control: Secondary | ICD-10-CM | POA: Diagnosis present

## 2017-04-16 DIAGNOSIS — Z713 Dietary counseling and surveillance: Secondary | ICD-10-CM | POA: Insufficient documentation

## 2017-04-16 NOTE — Progress Notes (Signed)
Patient was seen on 04/16/17 for Gestational Diabetes self-management class at the [facility]. The following learning objectives were met by the patient during this course:   States the definition of Gestational Diabetes  States why dietary management is important in controlling blood glucose  Describes the effects each nutrient has on blood glucose levels  Demonstrates ability to create a balanced meal plan  Demonstrates carbohydrate counting   States when to check blood glucose levels  Demonstrates proper blood glucose monitoring techniques  States the effect of stress and exercise on blood glucose levels  States the importance of limiting caffeine and abstaining from alcohol and smoking  Blood glucose monitor given: none Lot # n/a Exp: n/a Blood glucose reading: n/a  Patient instructed to monitor glucose levels: FBS: 60 - <95 1 hour: <140 2 hour: <120  Patient received handouts:  Nutrition Diabetes and Pregnancy  Carbohydrate Counting List  Patient will be seen for follow-up as needed.

## 2017-04-17 ENCOUNTER — Encounter: Payer: Self-pay | Admitting: *Deleted

## 2017-04-17 ENCOUNTER — Ambulatory Visit (INDEPENDENT_AMBULATORY_CARE_PROVIDER_SITE_OTHER): Payer: Medicaid Other | Admitting: Obstetrics & Gynecology

## 2017-04-17 VITALS — BP 111/76 | HR 116 | Wt 133.0 lb

## 2017-04-17 DIAGNOSIS — O09212 Supervision of pregnancy with history of pre-term labor, second trimester: Secondary | ICD-10-CM

## 2017-04-17 DIAGNOSIS — Z8751 Personal history of pre-term labor: Secondary | ICD-10-CM

## 2017-04-17 DIAGNOSIS — O24410 Gestational diabetes mellitus in pregnancy, diet controlled: Secondary | ICD-10-CM

## 2017-04-17 DIAGNOSIS — Z23 Encounter for immunization: Secondary | ICD-10-CM | POA: Diagnosis not present

## 2017-04-17 DIAGNOSIS — Z98891 History of uterine scar from previous surgery: Secondary | ICD-10-CM

## 2017-04-17 DIAGNOSIS — O0992 Supervision of high risk pregnancy, unspecified, second trimester: Secondary | ICD-10-CM

## 2017-04-17 DIAGNOSIS — O34219 Maternal care for unspecified type scar from previous cesarean delivery: Secondary | ICD-10-CM

## 2017-04-17 NOTE — Progress Notes (Signed)
   PRENATAL VISIT NOTE  Subjective:  Shannon Archer is a 31 y.o. [phone] at [redacted]w[redacted]d being seen today for ongoing prenatal care.  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  Patient reports cramping like she is going to start a period. More contactions at work this week, resolves with bedrest..   .  .   . Denies leaking of fluid.   The following portions of the patient's history were reviewed and updated as appropriate: allergies, current medications, past family history, past medical history, past social history, past surgical history and problem list. Problem list updated.  Objective:  There were no vitals filed for this visit.  Fetal Status:           General:  Alert, oriented and cooperative. Patient is in no acute distress.  Skin: Skin is warm and dry. No rash noted.   Cardiovascular: Normal heart rate noted  Respiratory: Normal respiratory effort, no problems with respiration noted  Abdomen: Soft, gravid, appropriate for gestational age.        Pelvic: Cervical exam performed        Extremities: Normal range of motion.     Mental Status:  Normal mood and affect. Normal behavior. Normal judgment and thought content.   Assessment and Plan:  Pregnancy: [id number] at [redacted]w[redacted]d  1. Supervision of high risk pregnancy, antepartum, second trimester   2. History of preterm delivery - on 13 P - Rec out of work for the present  3. History of cesarean delivery   4. Diet controlled gestational diabetes mellitus (GDM) in third trimester - She saw the dietician yesterday. Her sugars from the last 2 weeks are generally fine, only a few abnormal values  Preterm labor symptoms and general obstetric precautions  including but not limited to vaginal bleeding, contractions, leaking of fluid and fetal movement were reviewed in detail with the patient. Please refer to After Visit Summary for other counseling recommendations.  No Follow-up on file.   Allie Bossier, MD

## 2017-04-17 NOTE — Progress Notes (Signed)
Increased discharge and contractions

## 2017-04-21 ENCOUNTER — Other Ambulatory Visit: Payer: Self-pay | Admitting: Family Medicine

## 2017-04-24 ENCOUNTER — Ambulatory Visit (INDEPENDENT_AMBULATORY_CARE_PROVIDER_SITE_OTHER): Payer: Medicaid Other

## 2017-04-24 DIAGNOSIS — O0992 Supervision of high risk pregnancy, unspecified, second trimester: Secondary | ICD-10-CM

## 2017-04-24 DIAGNOSIS — O09213 Supervision of pregnancy with history of pre-term labor, third trimester: Secondary | ICD-10-CM

## 2017-04-24 NOTE — Progress Notes (Addendum)
Pt presented to the office today for her 17-p injection. Patient tolerated well and will follow up next week.

## 2017-05-01 ENCOUNTER — Ambulatory Visit (INDEPENDENT_AMBULATORY_CARE_PROVIDER_SITE_OTHER): Payer: Medicaid Other | Admitting: Obstetrics and Gynecology

## 2017-05-01 ENCOUNTER — Inpatient Hospital Stay (HOSPITAL_COMMUNITY)
Admission: AD | Admit: 2017-05-01 | Discharge: 2017-05-01 | Disposition: A | Discharge status: Home / Self Care | Payer: Medicaid Other | Source: Ambulatory Visit | Attending: Obstetrics & Gynecology | Admitting: Obstetrics & Gynecology

## 2017-05-01 ENCOUNTER — Encounter (HOSPITAL_COMMUNITY): Payer: Self-pay | Admitting: *Deleted

## 2017-05-01 VITALS — BP 119/73 | HR 103 | Wt 133.8 lb

## 2017-05-01 DIAGNOSIS — O0992 Supervision of high risk pregnancy, unspecified, second trimester: Secondary | ICD-10-CM

## 2017-05-01 DIAGNOSIS — Z8751 Personal history of pre-term labor: Secondary | ICD-10-CM

## 2017-05-01 DIAGNOSIS — Z8632 Personal history of gestational diabetes: Secondary | ICD-10-CM | POA: Insufficient documentation

## 2017-05-01 DIAGNOSIS — Z880 Allergy status to penicillin: Secondary | ICD-10-CM | POA: Insufficient documentation

## 2017-05-01 DIAGNOSIS — O479 False labor, unspecified: Secondary | ICD-10-CM

## 2017-05-01 DIAGNOSIS — O99333 Smoking (tobacco) complicating pregnancy, third trimester: Secondary | ICD-10-CM | POA: Insufficient documentation

## 2017-05-01 DIAGNOSIS — F17210 Nicotine dependence, cigarettes, uncomplicated: Secondary | ICD-10-CM | POA: Insufficient documentation

## 2017-05-01 DIAGNOSIS — O24410 Gestational diabetes mellitus in pregnancy, diet controlled: Secondary | ICD-10-CM

## 2017-05-01 DIAGNOSIS — Z9889 Other specified postprocedural states: Secondary | ICD-10-CM | POA: Insufficient documentation

## 2017-05-01 DIAGNOSIS — Z3A31 31 weeks gestation of pregnancy: Secondary | ICD-10-CM | POA: Diagnosis not present

## 2017-05-01 DIAGNOSIS — Z98891 History of uterine scar from previous surgery: Secondary | ICD-10-CM

## 2017-05-01 DIAGNOSIS — O09212 Supervision of pregnancy with history of pre-term labor, second trimester: Secondary | ICD-10-CM

## 2017-05-01 DIAGNOSIS — O4700 False labor before 37 completed weeks of gestation, unspecified trimester: Secondary | ICD-10-CM

## 2017-05-01 DIAGNOSIS — O4703 False labor before 37 completed weeks of gestation, third trimester: Secondary | ICD-10-CM | POA: Insufficient documentation

## 2017-05-01 MED ORDER — NIFEDIPINE 10 MG PO CAPS
10.0000 mg | ORAL_CAPSULE | ORAL | Status: AC
  Administered 2017-05-01 (×3): 10 mg via ORAL
  Filled 2017-05-01 (×3): qty 1

## 2017-05-01 NOTE — MAU Note (Signed)
Pt sent to MAU for PTL eval.  Pt states she was @ office today, cervical exam was 1cm & 50% effaced, changed from cervical exam 2 weeks ago.

## 2017-05-01 NOTE — Progress Notes (Addendum)
Prenatal Visit Note Date: 05/01/2017 [facility]  Subjective:  Shannon Archer is a 31 y.o. [phone] at 7816w2d being seen today for ongoing prenatal care.  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  Patient reports constant daily, irregular contractions. no VB, LOF. would like cx check.   Contractions: Irregular. Vag. Bleeding: None.  Movement: Present. Denies leaking of fluid.   The following portions of the patient's history were reviewed and updated as appropriate: allergies, current medications, past family history, past medical history, past social history, past surgical history and problem list. Problem list updated.  Objective:   Vitals:   05/01/17 1031  BP: 119/73  Pulse: (!) 103  Weight: 133 lb 12.8 oz (60.7 kg)    Fetal Status: Fetal Heart Rate (bpm): 150 Fundal Height: 31 cm Movement: Present     General:  Alert, oriented and cooperative. Patient is in no acute distress.  Skin: Skin is warm and dry. No rash noted.   Cardiovascular: Normal heart rate noted  Respiratory: Normal respiratory effort, no problems with respiration noted  Abdomen: Soft, gravid, appropriate for gestational age. Pain/Pressure: Present     Pelvic:  Cervical exam performed Dilation: 1 (tight 1cm) Effacement (%): 30 Station: -2  Extremities: Normal range of motion.  Edema: None  Mental Status: Normal mood and affect. Normal behavior. Normal judgment and thought content.   Urinalysis:      Assessment and Plan:  Pregnancy: [id number] at 7016w2d  1. Supervision of high risk pregnancy, antepartum, second trimester BTL papers already signed  2. History of  cesarean delivery D/w pt nv if still would like rpt and btl  3. History of preterm delivery 17p today. Pt had sex last night so unable to do FFN. SVE tight 1cm/25/-2. Recommend she go to MAU for EFM and to repeat cx check in MAU since clinic is closed this afternoon. MAU aware.   4. GDM (gestational diabetes mellitus), class A1 Normal BS log. Consider 34-38wk growth u/s  Preterm labor symptoms and general obstetric precautions including but not limited to vaginal bleeding, contractions, leaking of fluid and fetal movement were reviewed in detail with the patient. Please refer to After Visit Summary for other counseling recommendations.  Return in about 1 week (around 05/08/2017) for 1wk 17p. 2wk rob and 17p.   [place] BingPickens, Naira Standiford, MD

## 2017-05-01 NOTE — MAU Note (Signed)
Urine in lab 

## 2017-05-01 NOTE — Addendum Note (Signed)
Addended by: Cheree DittoGRAHAM, Asra Gambrel A on: 05/01/2017 10:51 AM   Modules accepted: Kipp BroodSmartSet

## 2017-05-01 NOTE — Discharge Instructions (Signed)
Braxton Hicks Contractions °Contractions of the uterus can occur throughout pregnancy, but they are not always a sign that you are in labor. You may have practice contractions called Braxton Hicks contractions. These false labor contractions are sometimes confused with true labor. °What are Braxton Hicks contractions? °Braxton Hicks contractions are tightening movements that occur in the muscles of the uterus before labor. Unlike true labor contractions, these contractions do not result in opening (dilation) and thinning of the cervix. Toward the end of pregnancy (32-34 weeks), Braxton Hicks contractions can happen more often and may become stronger. These contractions are sometimes difficult to tell apart from true labor because they can be very uncomfortable. You should not feel embarrassed if you go to the hospital with false labor. °Sometimes, the only way to tell if you are in true labor is for your health care provider to look for changes in the cervix. The health care provider will do a physical exam and may monitor your contractions. If you are not in true labor, the exam should show that your cervix is not dilating and your water has not broken. °If there are no prenatal problems or other health problems associated with your pregnancy, it is completely safe for you to be sent home with false labor. You may continue to have Braxton Hicks contractions until you go into true labor. °How can I tell the difference between true labor and false labor? °· Differences °? False labor °? Contractions last 30-70 seconds.: Contractions are usually shorter and not as strong as true labor contractions. °? Contractions become very regular.: Contractions are usually irregular. °? Discomfort is usually felt in the top of the uterus, and it spreads to the lower abdomen and low back.: Contractions are often felt in the front of the lower abdomen and in the groin. °? Contractions do not go away with walking.: Contractions may  go away when you walk around or change positions while lying down. °? Contractions usually become more intense and increase in frequency.: Contractions get weaker and are shorter-lasting as time goes on. °? The cervix dilates and gets thinner.: The cervix usually does not dilate or become thin. °Follow these instructions at home: °· Take over-the-counter and prescription medicines only as told by your health care provider. °· Keep up with your usual exercises and follow other instructions from your health care provider. °· Eat and drink lightly if you think you are going into labor. °· If Braxton Hicks contractions are making you uncomfortable: °? Change your position from lying down or resting to walking, or change from walking to resting. °? Sit and rest in a tub of warm water. °? Drink enough fluid to keep your urine clear or pale yellow. Dehydration may cause these contractions. °? Do slow and deep breathing several times an hour. °· Keep all follow-up prenatal visits as told by your health care provider. This is important. °Contact a health care provider if: °· You have a fever. °· You have continuous pain in your abdomen. °Get help right away if: °· Your contractions become stronger, more regular, and closer together. °· You have fluid leaking or gushing from your vagina. °· You pass blood-tinged mucus (bloody show). °· You have bleeding from your vagina. °· You have low back pain that you never had before. °· You feel your baby’s head pushing down and causing pelvic pressure. °· Your baby is not moving inside you as much as it used to. °Summary °· Contractions that occur before labor are   called Braxton Hicks contractions, false labor, or practice contractions. °· Braxton Hicks contractions are usually shorter, weaker, farther apart, and less regular than true labor contractions. True labor contractions usually become progressively stronger and regular and they become more frequent. °· Manage discomfort from  Braxton Hicks contractions by changing position, resting in a warm bath, drinking plenty of water, or practicing deep breathing. °This information is not intended to replace advice given to you by your health care provider. Make sure you discuss any questions you have with your health care provider. °Document Released: 06/24/2005 Document Revised: 05/13/2016 Document Reviewed: 05/13/2016 °Elsevier Interactive Patient Education © 2017 Elsevier Inc. ° ° °Pelvic Rest °Pelvic rest may be recommended if: °· Your placenta is partially or completely covering the opening of your cervix (placenta previa). °· There is bleeding between the wall of the uterus and the amniotic sac in the first trimester of pregnancy (subchorionic hemorrhage). °· You went into labor too early (preterm labor). ° °Based on your overall health and the health of your baby, your health care provider will decide if pelvic rest is right for you. °How do I rest my pelvis? °For as long as told by your health care provider: °· Do not have sex, sexual stimulation, or an orgasm. °· Do not use tampons. Do not douche. Do not put anything in your vagina. °· Do not lift anything that is heavier than 10 lb (4.5 kg). °· Avoid activities that take a lot of effort (are strenuous). °· Avoid any activity in which your pelvic muscles could become strained. ° °When should I seek medical care? °Seek medical care if you have: °· Cramping pain in your lower abdomen. °· Vaginal discharge. °· A low, dull backache. °· Regular contractions. °· Uterine tightening. ° °When should I seek immediate medical care? °Seek immediate medical care if: °· You have vaginal bleeding and you are pregnant. ° °This information is not intended to replace advice given to you by your health care provider. Make sure you discuss any questions you have with your health care provider. °Document Released: 10/19/2010 Document Revised: 11/30/2015 Document Reviewed: 12/26/2014 °Elsevier Interactive  Patient Education © 2018 Elsevier Inc. ° °

## 2017-05-01 NOTE — MAU Provider Note (Signed)
History     CSN: [id number]  Arrival date and time: 05/01/17 1305   First Provider Initiated Contact with Patient 05/01/17 1357      Chief Complaint  Patient presents with  . Contractions   HPI   Ms.Shannon Archer is a 31 y.o. female [phone] @ [redacted]w[redacted]d here in MAU with contractions and threatened preterm labor. States she was seen at [facility] today for her OB appointment and was noted to be dilated. Her cervix was 1 cm which is a change from her cervical exam 2 weeks ago. She states she feels occasional contractions, however nothing regular. She denies vaginal bleeding. + fetal movement. Patient receiving 17p weekly due to previous 36 week delivery.   OB History    Gravida Para Term Preterm AB Living   3 1 0 1 1 1    SAB TAB Ectopic Multiple Live Births   1 0 0 0 1      Obstetric Comments   36wks spontaneous PTB (PPROM). Previous C/S - Breech Presentation.       Past Medical History:  Diagnosis Date  . Diabetes mellitus without complication (HCC)   . Gestational diabetes   . Medical history non-contributory     Past Surgical History:  Procedure Laterality Date  . CESAREAN SECTION     Breech  . DILATION AND EVACUATION N/A 08/11/2014   Procedure: DILATATION AND EVACUATION;  Surgeon: Juluis Mire, MD;  Location: [facility];  Service: Gynecology;  Laterality: N/A;  . NOSE SURGERY      History reviewed. No pertinent family history.  Social History  Substance Use Topics  . Smoking status: Current Every Day Smoker    Packs/day: 0.50    Types: Cigarettes  . Smokeless tobacco: Never Used  . Alcohol use No    Allergies:  Allergies  Allergen Reactions  . Penicillins Nausea And Vomiting    Facility-Administered Medications Prior to Admission  Medication Dose Route Frequency Provider Last Rate Last Dose  . hydroxyprogesterone caproate (MAKENA) 250 mg/mL injection 250 mg  250 mg Intramuscular Weekly Reva Bores, MD   250 mg at 04/24/17 1058   Prescriptions Prior to  Admission  Medication Sig Dispense Refill Last Dose  . ACCU-CHEK FASTCLIX LANCETS MISC 1 each by Percutaneous route 4 (four) times daily. 100 each 12 Taking  . glucose blood test strip Use as instructed 100 each 12 Taking   No results found for this or any previous visit (from the past 48 hour(s)).  Review of Systems  Gastrointestinal: Negative for abdominal pain.  Genitourinary: Negative for vaginal bleeding and vaginal discharge.   Physical Exam   Blood pressure (!) 108/56, pulse (!) 109, resp. rate 18, last menstrual period 09/24/2016.  Physical Exam  Constitutional: She is oriented to person, place, and time. She appears well-developed and well-nourished. No distress.  HENT:  Head: Normocephalic.  Eyes: Pupils are equal, round, and reactive to light.  Respiratory: Effort normal.  GI: Soft. She exhibits no distension. There is no tenderness. There is no rebound.  Genitourinary:  Genitourinary Comments: Dilation: 1 Effacement (%): 50 Station: -2 Exam by:: Shela Commons Ashlley Booher, NP  Musculoskeletal: Normal range of motion.  Neurological: She is alert and oriented to person, place, and time.  Skin: Skin is warm. She is not diaphoretic.  Psychiatric: Her behavior is normal.   Fetal Tracing: Baseline: 145 bpm Variability: Moderate  Accelerations: 15x15 Decelerations: None Toco: Occasional with UI  MAU Course  Procedures  None  MDM  Procardia 10  mg Q20 X 3 doses given Patient without pain at the time of discharge home Reviewed patient with Dr. Emelda FearFerguson. Unable to collect FFN due to recent cervical exam and recent intercourse.   Assessment and Plan   A:  1. Threatened preterm labor, third trimester   2. Preterm uterine contractions     P:  Discharge home with strict return precautions Preterm labor precautions Return to MAU if symptoms worsen Pelvic rest Follow up with OB as scheduled or sooner if needed.  Venia Carbonasch, Marlean Mortell I, NP 05/01/2017 5:01 PM

## 2017-05-08 ENCOUNTER — Ambulatory Visit (INDEPENDENT_AMBULATORY_CARE_PROVIDER_SITE_OTHER): Payer: Medicaid Other

## 2017-05-08 VITALS — BP 121/72

## 2017-05-08 DIAGNOSIS — O09213 Supervision of pregnancy with history of pre-term labor, third trimester: Secondary | ICD-10-CM | POA: Diagnosis not present

## 2017-05-08 DIAGNOSIS — Z8751 Personal history of pre-term labor: Secondary | ICD-10-CM

## 2017-05-08 NOTE — Progress Notes (Signed)
Patient presented to the office today for her 17- p injection. Patient tolerated well and will follow up

## 2017-05-15 ENCOUNTER — Ambulatory Visit (INDEPENDENT_AMBULATORY_CARE_PROVIDER_SITE_OTHER): Payer: Medicaid Other | Admitting: Obstetrics and Gynecology

## 2017-05-15 VITALS — BP 110/72 | HR 102 | Wt 133.2 lb

## 2017-05-15 DIAGNOSIS — O0992 Supervision of high risk pregnancy, unspecified, second trimester: Secondary | ICD-10-CM

## 2017-05-15 DIAGNOSIS — O09212 Supervision of pregnancy with history of pre-term labor, second trimester: Secondary | ICD-10-CM

## 2017-05-15 DIAGNOSIS — O24410 Gestational diabetes mellitus in pregnancy, diet controlled: Secondary | ICD-10-CM

## 2017-05-15 DIAGNOSIS — Z98891 History of uterine scar from previous surgery: Secondary | ICD-10-CM

## 2017-05-15 DIAGNOSIS — Z8751 Personal history of pre-term labor: Secondary | ICD-10-CM

## 2017-05-15 NOTE — Progress Notes (Signed)
17-P given today  

## 2017-05-15 NOTE — Progress Notes (Signed)
Prenatal Visit Note Date: 05/15/2017 [facility]  Subjective:  Shannon Archer is a 31 y.o. [phone] at 5676w2d being seen today for ongoing prenatal care.  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  Patient reports no complaints.   Contractions: Irregular. Vag. Bleeding: None.  Movement: Present. Denies leaking of fluid.   The following portions of the patient's history were reviewed and updated as appropriate: allergies, current medications, past family history, past medical history, past social history, past surgical history and problem list. Problem list updated.  Objective:   Vitals:   05/15/17 1038  BP: 110/72  Pulse: (!) 102  Weight: 133 lb 3.2 oz (60.4 kg)    Fetal Status: Fetal Heart Rate (bpm): 154 Fundal Height: 33 cm Movement: Present  Presentation: Vertex  General:  Alert, oriented and cooperative. Patient is in no acute distress.  Skin: Skin is warm and dry. No rash noted.   Cardiovascular: Normal heart rate noted  Respiratory: Normal respiratory effort, no problems with respiration noted  Abdomen: Soft, gravid, appropriate for gestational age. Pain/Pressure: Present     Pelvic:  Cervical exam performed Dilation: 1 Effacement (%): 50 Station: -2; patient desired  Extremities: Normal range of motion.  Edema: None  Mental Status: Normal mood and affect. Normal behavior. Normal judgment and thought content.   Urinalysis:      Assessment and Plan:  Pregnancy: [id number] at 5476w2d  1. GDM (gestational diabetes mellitus), class A1 Pt hasn't been checking for past week due to busy schedule. Importance of checking BS values d/w pt.  - US  MFM OB FOLLOW UP; Future - Hgb A1c w/o eAG - Glucose  2. Supervision of high risk pregnancy, antepartum, second trimester - US MFM OB FOLLOW UP; Future  3. History of cesarean delivery D/w pt nv re: delivery mode  4. History of preterm delivery 17p today.   Preterm labor symptoms and general obstetric precautions including but not limited to vaginal bleeding, contractions, leaking of fluid and fetal movement were reviewed in detail with the patient. Please refer to After Visit Summary for other counseling recommendations.  Return in about 1 week (around 05/22/2017) for 17p and 2wk rob and 17p.   [place] BingPickens, Katherinne Mofield, MD

## 2017-05-16 LAB — HGB A1C W/O EAG: HEMOGLOBIN A1C: 5.4 % (ref 4.8–5.6)

## 2017-05-16 LAB — GLUCOSE, RANDOM: GLUCOSE: 85 mg/dL (ref 65–99)

## 2017-05-22 ENCOUNTER — Ambulatory Visit (INDEPENDENT_AMBULATORY_CARE_PROVIDER_SITE_OTHER): Payer: Medicaid Other

## 2017-05-22 ENCOUNTER — Other Ambulatory Visit: Payer: Self-pay | Admitting: Obstetrics and Gynecology

## 2017-05-22 ENCOUNTER — Ambulatory Visit (HOSPITAL_COMMUNITY)
Admission: RE | Admit: 2017-05-22 | Discharge: 2017-05-22 | Disposition: A | Discharge status: Home / Self Care | Payer: Medicaid Other | Source: Ambulatory Visit | Attending: Obstetrics and Gynecology | Admitting: Obstetrics and Gynecology

## 2017-05-22 ENCOUNTER — Encounter (HOSPITAL_COMMUNITY): Payer: Self-pay

## 2017-05-22 VITALS — BP 104/68 | HR 104

## 2017-05-22 DIAGNOSIS — O24410 Gestational diabetes mellitus in pregnancy, diet controlled: Secondary | ICD-10-CM | POA: Diagnosis not present

## 2017-05-22 DIAGNOSIS — Z362 Encounter for other antenatal screening follow-up: Secondary | ICD-10-CM | POA: Diagnosis present

## 2017-05-22 DIAGNOSIS — O34219 Maternal care for unspecified type scar from previous cesarean delivery: Secondary | ICD-10-CM | POA: Diagnosis not present

## 2017-05-22 DIAGNOSIS — Z3A34 34 weeks gestation of pregnancy: Secondary | ICD-10-CM | POA: Insufficient documentation

## 2017-05-22 DIAGNOSIS — O0992 Supervision of high risk pregnancy, unspecified, second trimester: Secondary | ICD-10-CM

## 2017-05-22 DIAGNOSIS — O09213 Supervision of pregnancy with history of pre-term labor, third trimester: Secondary | ICD-10-CM

## 2017-05-22 DIAGNOSIS — O99333 Smoking (tobacco) complicating pregnancy, third trimester: Secondary | ICD-10-CM | POA: Insufficient documentation

## 2017-05-22 DIAGNOSIS — O0993 Supervision of high risk pregnancy, unspecified, third trimester: Secondary | ICD-10-CM | POA: Insufficient documentation

## 2017-05-22 DIAGNOSIS — Z8751 Personal history of pre-term labor: Secondary | ICD-10-CM

## 2017-05-22 NOTE — Progress Notes (Signed)
Patient seen and assessed by nursing staff.  Agree with documentation and plan.  

## 2017-05-22 NOTE — Progress Notes (Signed)
Patient presented to the office today for 17-p injection. Patient tolerated well.

## 2017-05-28 ENCOUNTER — Inpatient Hospital Stay (HOSPITAL_COMMUNITY)
Admission: AD | Admit: 2017-05-28 | Discharge: 2017-05-28 | Disposition: A | Discharge status: Home / Self Care | Payer: Medicaid Other | Source: Ambulatory Visit | Attending: Obstetrics & Gynecology | Admitting: Obstetrics & Gynecology

## 2017-05-28 ENCOUNTER — Encounter (HOSPITAL_COMMUNITY): Payer: Self-pay

## 2017-05-28 ENCOUNTER — Ambulatory Visit (INDEPENDENT_AMBULATORY_CARE_PROVIDER_SITE_OTHER): Payer: Medicaid Other | Admitting: Family Medicine

## 2017-05-28 VITALS — BP 111/77 | HR 102

## 2017-05-28 DIAGNOSIS — Z3A35 35 weeks gestation of pregnancy: Secondary | ICD-10-CM | POA: Diagnosis not present

## 2017-05-28 DIAGNOSIS — B9689 Other specified bacterial agents as the cause of diseases classified elsewhere: Secondary | ICD-10-CM | POA: Insufficient documentation

## 2017-05-28 DIAGNOSIS — Z79899 Other long term (current) drug therapy: Secondary | ICD-10-CM | POA: Diagnosis not present

## 2017-05-28 DIAGNOSIS — R1031 Right lower quadrant pain: Secondary | ICD-10-CM | POA: Diagnosis not present

## 2017-05-28 DIAGNOSIS — F17210 Nicotine dependence, cigarettes, uncomplicated: Secondary | ICD-10-CM | POA: Insufficient documentation

## 2017-05-28 DIAGNOSIS — O0992 Supervision of high risk pregnancy, unspecified, second trimester: Secondary | ICD-10-CM

## 2017-05-28 DIAGNOSIS — O24419 Gestational diabetes mellitus in pregnancy, unspecified control: Secondary | ICD-10-CM | POA: Insufficient documentation

## 2017-05-28 DIAGNOSIS — O23593 Infection of other part of genital tract in pregnancy, third trimester: Secondary | ICD-10-CM

## 2017-05-28 DIAGNOSIS — N760 Acute vaginitis: Secondary | ICD-10-CM | POA: Insufficient documentation

## 2017-05-28 DIAGNOSIS — O34219 Maternal care for unspecified type scar from previous cesarean delivery: Secondary | ICD-10-CM | POA: Insufficient documentation

## 2017-05-28 DIAGNOSIS — R1032 Left lower quadrant pain: Secondary | ICD-10-CM | POA: Diagnosis not present

## 2017-05-28 DIAGNOSIS — Z98891 History of uterine scar from previous surgery: Secondary | ICD-10-CM

## 2017-05-28 DIAGNOSIS — O99333 Smoking (tobacco) complicating pregnancy, third trimester: Secondary | ICD-10-CM | POA: Diagnosis not present

## 2017-05-28 DIAGNOSIS — O26893 Other specified pregnancy related conditions, third trimester: Secondary | ICD-10-CM | POA: Insufficient documentation

## 2017-05-28 DIAGNOSIS — O24410 Gestational diabetes mellitus in pregnancy, diet controlled: Secondary | ICD-10-CM

## 2017-05-28 DIAGNOSIS — Z880 Allergy status to penicillin: Secondary | ICD-10-CM | POA: Insufficient documentation

## 2017-05-28 DIAGNOSIS — O368130 Decreased fetal movements, third trimester, not applicable or unspecified: Secondary | ICD-10-CM

## 2017-05-28 LAB — URINALYSIS, ROUTINE W REFLEX MICROSCOPIC
BILIRUBIN URINE: NEGATIVE
Glucose, UA: 50 mg/dL — AB
KETONES UR: NEGATIVE mg/dL
NITRITE: NEGATIVE
PH: 5 (ref 5.0–8.0)
PROTEIN: 30 mg/dL — AB
Specific Gravity, Urine: 1.02 (ref 1.005–1.030)

## 2017-05-28 LAB — WET PREP, GENITAL
Sperm: NONE SEEN
TRICH WET PREP: NONE SEEN
YEAST WET PREP: NONE SEEN

## 2017-05-28 MED ORDER — METRONIDAZOLE 500 MG PO TABS: 500.0000 mg | ORAL_TABLET | Freq: Three times a day (TID) | ORAL | 0 refills | Status: AC

## 2017-05-28 NOTE — MAU Provider Note (Signed)
Patient Shannon Archer is a 31 y.o. [phone]  At 9861w1d here with complaints of vaginal bleeding. She denies decreased fetal movement or other discharge.  She is a GDMA-1 on diet control.  Patient was seen and had a cervical exam today [facility]. This evening she had some drops of brown blood when she wiped around 8 pm, and then she had a few more since then. She has not had to wear a pad or change her underwear.  History     CSN: [id number]  Arrival date and time: 05/28/17 2103   First Provider Initiated Contact with Patient 05/28/17 2255      Chief Complaint  Patient presents with  . Abdominal Pain  . Vaginal Bleeding   Abdominal Pain  This is a new problem. The current episode started today. The onset quality is sudden. The pain is at a severity of 2/10. The abdominal pain radiates to the LLQ and RLQ. Pertinent negatives include no dysuria, nausea or vomiting.  Vaginal Bleeding  The patient's primary symptoms include vaginal bleeding. This is a new problem. The current episode started today. The problem has been gradually improving. Associated symptoms include abdominal pain. Pertinent negatives include no dysuria, nausea, urgency or vomiting. The vaginal bleeding is spotting. She has not been passing clots. She has not been passing tissue. Nothing aggravates the symptoms. She has tried nothing for the symptoms.    OB History    Gravida Para Term Preterm AB Living   3 1 0 1 1 1    SAB TAB Ectopic Multiple Live Births   1 0 0 0 1      Obstetric Comments   36wks spontaneous PTB (PPROM). Previous C/S - Breech Presentation.       Past Medical History:  Diagnosis Date  . Diabetes mellitus without complication (HCC)   . Gestational diabetes   . Medical history non-contributory     Past Surgical History:  Procedure Laterality Date  . CESAREAN SECTION     Breech  . DILATION AND EVACUATION N/A 08/11/2014   Procedure: DILATATION AND EVACUATION;  Surgeon:  Juluis MireJohn S McComb, MD;  Location: [facility];  Service: Gynecology;  Laterality: N/A;  . NOSE SURGERY      History reviewed. No pertinent family history.  Social History   Tobacco Use  . Smoking status: Current Every Day Smoker    Packs/day: 0.50    Types: Cigarettes  . Smokeless tobacco: Never Used  Substance Use Topics  . Alcohol use: No  . Drug use: No    Allergies:  Allergies  Allergen Reactions  . Penicillins Nausea And Vomiting    Has patient had a PCN reaction causing immediate rash, facial/tongue/throat swelling, SOB or lightheadedness with hypotension: No Has patient had a PCN reaction causing severe rash involving mucus membranes or skin necrosis: No Has patient had a PCN reaction that required hospitalization: No Has patient had a PCN reaction occurring within the last 10 years: No If all of the above answers are "NO", then may proceed with Cephalosporin use.     Facility-Administered Medications Prior to Admission  Medication Dose Route Frequency Provider Last Rate Last Dose  . hydroxyprogesterone caproate (MAKENA) 250 mg/mL injection 250 mg  250 mg Intramuscular Weekly Reva BoresPratt, Tanya S, MD   250 mg at 04/24/17 1058   Medications Prior to Admission  Medication Sig Dispense Refill Last Dose  . ACCU-CHEK FASTCLIX LANCETS MISC 1 each by Percutaneous route 4 (four)  times daily. 100 each 12 05/28/2017 at Unknown time  . glucose blood test strip Use as instructed 100 each 12 05/28/2017 at Unknown time    Review of Systems  HENT: Negative.   Respiratory: Negative.   Gastrointestinal: Positive for abdominal pain. Negative for nausea and vomiting.  Genitourinary: Positive for vaginal bleeding. Negative for difficulty urinating, dysuria and urgency.  Neurological: Negative.   Hematological: Negative.    Physical Exam   Blood pressure 115/70, pulse (!) 104, temperature 98 F (36.7 C), temperature source Oral, resp. rate 18, height 5\' 2"  (1.575 m), weight 138 lb (62.6 kg),  last menstrual period 09/24/2016, SpO2 100 %.  Physical Exam  Constitutional: She appears well-developed.  HENT:  Head: Normocephalic.  Neck: Normal range of motion.  Respiratory: Effort normal.  GI: Soft.  Genitourinary:  Genitourinary Comments: NEFG; no lesions on internal walls. Cervix with some mucous-bloody discharge extruding from the vagina, no other discharge. Cervix is 1 cm/90/0 station.   Musculoskeletal: Normal range of motion.    MAU Course  Procedures  MDM -NST -gc ct pending -wet prep: positive for clue cells  -Blood type is 0 positive -NST: 130, + acel, -decl, mod var, uterine irritability and infrequent contractions Patient had no bleeding while in MAU.   Assessment and Plan   1. Bacterial vaginosis   2. GDM (gestational diabetes mellitus), class A1   3. Supervision of high risk pregnancy, antepartum, second trimester    2. Patient stable for discharge after no active bleeding, no pain and reactive NST.  3. Reassured patient that bleeding probably due to post-cervical check, and she may have some more spotting and bleeding tomorrow after being checked tonight in MAU.  4. Reviewed warning signs and when to return; RX for flagyl given.  5. Patient and FOB verbalized understanding.   Shannon Archer 05/28/2017, 11:28 PM

## 2017-05-28 NOTE — Progress Notes (Signed)
   PRENATAL VISIT NOTE  Subjective:  Shannon Archer is a 31 y.o. [phone] at 4558w1d being seen today for ongoing prenatal care.  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  Patient reports no complaints.  Contractions: Irregular. Vag. Bleeding: None.  Movement: Present. Denies leaking of fluid.   The following portions of the patient's history were reviewed and updated as appropriate: allergies, current medications, past family history, past medical history, past social history, past surgical history and problem list. Problem list updated.  Objective:   Vitals:   05/28/17 0959  BP: 111/77  Pulse: (!) 102    Fetal Status: Fetal Heart Rate (bpm): 127 Fundal Height: 35 cm Movement: Present  Presentation: Vertex  General:  Alert, oriented and cooperative. Patient is in no acute distress.  Skin: Skin is warm and dry. No rash noted.   Cardiovascular: Normal heart rate noted  Respiratory: Normal respiratory effort, no problems with respiration noted  Abdomen: Soft, gravid, appropriate for gestational age.  Pain/Pressure: Present     Pelvic: Cervical exam performed Dilation: 1 Effacement (%): 90 Station: 0  Extremities: Normal range of motion.  Edema: None  Mental Status:  Normal mood and affect. Normal behavior. Normal judgment and thought content.  CBG meter reviewed 88, 94, 124, 119, 133, 127, 92, 95, 95, 87, 82, 102 Assessment and Plan:  Pregnancy: G3P0111 at 7358w1d  1. GDM (gestational diabetes mellitus), class A1 Well controlled on diet  2. History of cesarean delivery Desires RCS with BTL--scheduled  3. Supervision of high risk pregnancy, antepartum, third  trimester Continue prenatal care.  4. History for prior preterm birth Continue 6817 P   Preterm labor symptoms and general obstetric precautions including but not limited to vaginal bleeding, contractions, leaking of fluid and fetal movement were reviewed in detail with the patient. Please refer to After Visit Summary for other counseling recommendations.  Return in 1 week (on 06/04/2017).   Reva Boresanya S Meygan Kyser, MD

## 2017-05-28 NOTE — Discharge Instructions (Signed)

## 2017-05-28 NOTE — MAU Note (Signed)
Pt here with c/o cramping and vaginal bleeding. Denies any leaking of fluid. Reports good fetal movement.

## 2017-05-28 NOTE — Patient Instructions (Signed)
Third Trimester of Pregnancy The third trimester is from week 28 through week 40 (months 7 through 9). The third trimester is a time when the unborn baby (fetus) is growing rapidly. At the end of the ninth month, the fetus is about 20 inches in length and weighs 6-10 pounds. Body changes during your third trimester Your body will continue to go through many changes during pregnancy. The changes vary from woman to woman. During the third trimester:  Your weight will continue to increase. You can expect to gain 25-35 pounds (11-16 kg) by the end of the pregnancy.  You may begin to get stretch marks on your hips, abdomen, and breasts.  You may urinate more often because the fetus is moving lower into your pelvis and pressing on your bladder.  You may develop or continue to have heartburn. This is caused by increased hormones that slow down muscles in the digestive tract.  You may develop or continue to have constipation because increased hormones slow digestion and cause the muscles that push waste through your intestines to relax.  You may develop hemorrhoids. These are swollen veins (varicose veins) in the rectum that can itch or be painful.  You may develop swollen, bulging veins (varicose veins) in your legs.  You may have increased body aches in the pelvis, back, or thighs. This is due to weight gain and increased hormones that are relaxing your joints.  You may have changes in your hair. These can include thickening of your hair, rapid growth, and changes in texture. Some women also have hair loss during or after pregnancy, or hair that feels dry or thin. Your hair will most likely return to normal after your baby is born.  Your breasts will continue to grow and they will continue to become tender. A yellow fluid (colostrum) may leak from your breasts. This is the first milk you are producing for your baby.  Your belly button may stick out.  You may notice more swelling in your hands,  face, or ankles.  You may have increased tingling or numbness in your hands, arms, and legs. The skin on your belly may also feel numb.  You may feel short of breath because of your expanding uterus.  You may have more problems sleeping. This can be caused by the size of your belly, increased need to urinate, and an increase in your body's metabolism.  You may notice the fetus "dropping," or moving lower in your abdomen (lightening).  You may have increased vaginal discharge.  You may notice your joints feel loose and you may have pain around your pelvic bone.  What to expect at prenatal visits You will have prenatal exams every 2 weeks until week 36. Then you will have weekly prenatal exams. During a routine prenatal visit:  You will be weighed to make sure you and the baby are growing normally.  Your blood pressure will be taken.  Your abdomen will be measured to track your baby's growth.  The fetal heartbeat will be listened to.  Any test results from the previous visit will be discussed.  You may have a cervical check near your due date to see if your cervix has softened or thinned (effaced).  You will be tested for Group B streptococcus. This happens between 35 and 37 weeks.  Your health care provider may ask you:  What your birth plan is.  How you are feeling.  If you are feeling the baby move.  If you have had   any abnormal symptoms, such as leaking fluid, bleeding, severe headaches, or abdominal cramping.  If you are using any tobacco products, including cigarettes, chewing tobacco, and electronic cigarettes.  If you have any questions.  Other tests or screenings that may be performed during your third trimester include:  Blood tests that check for low iron levels (anemia).  Fetal testing to check the health, activity level, and growth of the fetus. Testing is done if you have certain medical conditions or if there are problems during the  pregnancy.  Nonstress test (NST). This test checks the health of your baby to make sure there are no signs of problems, such as the baby not getting enough oxygen. During this test, a belt is placed around your belly. The baby is made to move, and its heart rate is monitored during movement.  What is false labor? False labor is a condition in which you feel small, irregular tightenings of the muscles in the womb (contractions) that usually go away with rest, changing position, or drinking water. These are called Braxton Hicks contractions. Contractions may last for hours, days, or even weeks before true labor sets in. If contractions come at regular intervals, become more frequent, increase in intensity, or become painful, you should see your health care provider. What are the signs of labor?  Abdominal cramps.  Regular contractions that start at 10 minutes apart and become stronger and more frequent with time.  Contractions that start on the top of the uterus and spread down to the lower abdomen and back.  Increased pelvic pressure and dull back pain.  A watery or bloody mucus discharge that comes from the vagina.  Leaking of amniotic fluid. This is also known as your "water breaking." It could be a slow trickle or a gush. Let your health care provider know if it has a color or strange odor. If you have any of these signs, call your health care provider right away, even if it is before your due date. Follow these instructions at home: Medicines  Follow your health care provider's instructions regarding medicine use. Specific medicines may be either safe or unsafe to take during pregnancy.  Take a prenatal vitamin that contains at least 600 micrograms (mcg) of folic acid.  If you develop constipation, try taking a stool softener if your health care provider approves. Eating and drinking  Eat a balanced diet that includes fresh fruits and vegetables, whole grains, good sources of protein  such as meat, eggs, or tofu, and low-fat dairy. Your health care provider will help you determine the amount of weight gain that is right for you.  Avoid raw meat and uncooked cheese. These carry germs that can cause birth defects in the baby.  If you have low calcium intake from food, talk to your health care provider about whether you should take a daily calcium supplement.  Eat four or five small meals rather than three large meals a day.  Limit foods that are high in fat and processed sugars, such as fried and sweet foods.  To prevent constipation: ? Drink enough fluid to keep your urine clear or pale yellow. ? Eat foods that are high in fiber, such as fresh fruits and vegetables, whole grains, and beans. Activity  Exercise only as directed by your health care provider. Most women can continue their usual exercise routine during pregnancy. Try to exercise for 30 minutes at least 5 days a week. Stop exercising if you experience uterine contractions.  Avoid heavy   lifting.  Do not exercise in extreme heat or humidity, or at high altitudes.  Wear low-heel, comfortable shoes.  Practice good posture.  You may continue to have sex unless your health care provider tells you otherwise. Relieving pain and discomfort  Take frequent breaks and rest with your legs elevated if you have leg cramps or low back pain.  Take warm sitz baths to soothe any pain or discomfort caused by hemorrhoids. Use hemorrhoid cream if your health care provider approves.  Wear a good support bra to prevent discomfort from breast tenderness.  If you develop varicose veins: ? Wear support pantyhose or compression stockings as told by your healthcare provider. ? Elevate your feet for 15 minutes, 3-4 times a day. Prenatal care  Write down your questions. Take them to your prenatal visits.  Keep all your prenatal visits as told by your health care provider. This is important. Safety  Wear your seat belt at  all times when driving.  Make a list of emergency phone numbers, including numbers for family, friends, the hospital, and police and fire departments. General instructions  Avoid cat litter boxes and soil used by cats. These carry germs that can cause birth defects in the baby. If you have a cat, ask someone to clean the litter box for you.  Do not travel far distances unless it is absolutely necessary and only with the approval of your health care provider.  Do not use hot tubs, steam rooms, or saunas.  Do not drink alcohol.  Do not use any products that contain nicotine or tobacco, such as cigarettes and e-cigarettes. If you need help quitting, ask your health care provider.  Do not use any medicinal herbs or unprescribed drugs. These chemicals affect the formation and growth of the baby.  Do not douche or use tampons or scented sanitary pads.  Do not cross your legs for long periods of time.  To prepare for the arrival of your baby: ? Take prenatal classes to understand, practice, and ask questions about labor and delivery. ? Make a trial run to the hospital. ? Visit the hospital and tour the maternity area. ? Arrange for maternity or paternity leave through employers. ? Arrange for family and friends to take care of pets while you are in the hospital. ? Purchase a rear-facing car seat and make sure you know how to install it in your car. ? Pack your hospital bag. ? Prepare the baby's nursery. Make sure to remove all pillows and stuffed animals from the baby's crib to prevent suffocation.  Visit your dentist if you have not gone during your pregnancy. Use a soft toothbrush to brush your teeth and be gentle when you floss. Contact a health care provider if:  You are unsure if you are in labor or if your water has broken.  You become dizzy.  You have mild pelvic cramps, pelvic pressure, or nagging pain in your abdominal area.  You have lower back pain.  You have persistent  nausea, vomiting, or diarrhea.  You have an unusual or bad smelling vaginal discharge.  You have pain when you urinate. Get help right away if:  Your water breaks before 37 weeks.  You have regular contractions less than 5 minutes apart before 37 weeks.  You have a fever.  You are leaking fluid from your vagina.  You have spotting or bleeding from your vagina.  You have severe abdominal pain or cramping.  You have rapid weight loss or weight gain.    You have shortness of breath with chest pain.  You notice sudden or extreme swelling of your face, hands, ankles, feet, or legs.  Your baby makes fewer than 10 movements in 2 hours.  You have severe headaches that do not go away when you take medicine.  You have vision changes. Summary  The third trimester is from week 28 through week 40, months 7 through 9. The third trimester is a time when the unborn baby (fetus) is growing rapidly.  During the third trimester, your discomfort may increase as you and your baby continue to gain weight. You may have abdominal, leg, and back pain, sleeping problems, and an increased need to urinate.  During the third trimester your breasts will keep growing and they will continue to become tender. A yellow fluid (colostrum) may leak from your breasts. This is the first milk you are producing for your baby.  False labor is a condition in which you feel small, irregular tightenings of the muscles in the womb (contractions) that eventually go away. These are called Braxton Hicks contractions. Contractions may last for hours, days, or even weeks before true labor sets in.  Signs of labor can include: abdominal cramps; regular contractions that start at 10 minutes apart and become stronger and more frequent with time; watery or bloody mucus discharge that comes from the vagina; increased pelvic pressure and dull back pain; and leaking of amniotic fluid. This information is not intended to replace advice  given to you by your health care provider. Make sure you discuss any questions you have with your health care provider. Document Released: 06/18/2001 Document Revised: 11/30/2015 Document Reviewed: 08/25/2012 Elsevier Interactive Patient Education  2017 [employer]  

## 2017-05-30 LAB — GC/CHLAMYDIA PROBE AMP (~~LOC~~) NOT AT ARMC
Chlamydia: NEGATIVE
Neisseria Gonorrhea: NEGATIVE

## 2017-06-05 ENCOUNTER — Ambulatory Visit: Payer: Medicaid Other

## 2017-06-10 ENCOUNTER — Telehealth (HOSPITAL_COMMUNITY): Payer: Self-pay | Admitting: *Deleted

## 2017-06-10 NOTE — Telephone Encounter (Signed)
Preadmission screen  

## 2017-06-12 ENCOUNTER — Other Ambulatory Visit (HOSPITAL_COMMUNITY)
Admission: RE | Admit: 2017-06-12 | Discharge: 2017-06-12 | Disposition: A | Discharge status: Home / Self Care | Payer: Medicaid Other | Source: Ambulatory Visit | Attending: Obstetrics & Gynecology | Admitting: Obstetrics & Gynecology

## 2017-06-12 ENCOUNTER — Encounter (HOSPITAL_COMMUNITY): Payer: Self-pay

## 2017-06-12 ENCOUNTER — Ambulatory Visit (INDEPENDENT_AMBULATORY_CARE_PROVIDER_SITE_OTHER): Payer: Medicaid Other | Admitting: Obstetrics & Gynecology

## 2017-06-12 VITALS — BP 114/75 | HR 96 | Wt 138.0 lb

## 2017-06-12 DIAGNOSIS — Z3A37 37 weeks gestation of pregnancy: Secondary | ICD-10-CM | POA: Insufficient documentation

## 2017-06-12 DIAGNOSIS — O0992 Supervision of high risk pregnancy, unspecified, second trimester: Secondary | ICD-10-CM

## 2017-06-12 DIAGNOSIS — O0993 Supervision of high risk pregnancy, unspecified, third trimester: Secondary | ICD-10-CM | POA: Diagnosis present

## 2017-06-12 DIAGNOSIS — Z98891 History of uterine scar from previous surgery: Secondary | ICD-10-CM

## 2017-06-12 DIAGNOSIS — O24410 Gestational diabetes mellitus in pregnancy, diet controlled: Secondary | ICD-10-CM

## 2017-06-12 NOTE — Progress Notes (Signed)
   PRENATAL VISIT NOTE  Subjective:  Shannon Archer is a 31 y.o. [phone] at 3570w2d being seen today for ongoing prenatal care.  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  Patient reports no complaints.  Contractions: Irregular. Vag. Bleeding: None.  Movement: Present. Denies leaking of fluid.   The following portions of the patient's history were reviewed and updated as appropriate: allergies, current medications, past family history, past medical history, past social history, past surgical history and problem list. Problem list updated.  Objective:   Vitals:   06/12/17 1031  BP: 114/75  Pulse: 96  Weight: 138 lb (62.6 kg)    Fetal Status: Fetal Heart Rate (bpm): 150   Movement: Present     General:  Alert, oriented and cooperative. Patient is in no acute distress.  Skin: Skin is warm and dry. No rash noted.   Cardiovascular: Normal heart rate noted  Respiratory: Normal respiratory effort, no problems with respiration noted  Abdomen: Soft, gravid, appropriate for gestational age.  Pain/Pressure: Absent     Pelvic: Cervical exam performed        Extremities: Normal range of motion.  Edema: None  Mental Status:  Normal mood and affect. Normal behavior. Normal judgment and thought content.   Assessment and Plan:  Pregnancy: [id number] at 470w2d  1. Supervision of high risk pregnancy, antepartum, second trimester  - Cervicovaginal ancillary only - Culture, beta strep (group b only)  2. History of cesarean delivery - She is scheduled for a RLTCS and BTL  3. GDM (gestational diabetes mellitus), class A1   Term labor symptoms and general obstetric precautions including but  not limited to vaginal bleeding, contractions, leaking of fluid and fetal movement were reviewed in detail with the patient. Please refer to After Visit Summary for other counseling recommendations.  No Follow-up on file.   Shannon BossierMyra C Adelei Scobey, MD

## 2017-06-13 LAB — CERVICOVAGINAL ANCILLARY ONLY
CHLAMYDIA, DNA PROBE: NEGATIVE
Neisseria Gonorrhea: NEGATIVE

## 2017-06-16 LAB — CULTURE, BETA STREP (GROUP B ONLY): STREP GP B CULTURE: NEGATIVE

## 2017-06-18 ENCOUNTER — Ambulatory Visit (INDEPENDENT_AMBULATORY_CARE_PROVIDER_SITE_OTHER): Payer: Medicaid Other | Admitting: Family Medicine

## 2017-06-18 ENCOUNTER — Encounter: Payer: Self-pay | Admitting: Family Medicine

## 2017-06-18 VITALS — BP 118/74 | HR 106 | Wt 140.0 lb

## 2017-06-18 DIAGNOSIS — N898 Other specified noninflammatory disorders of vagina: Secondary | ICD-10-CM | POA: Diagnosis not present

## 2017-06-18 DIAGNOSIS — O0993 Supervision of high risk pregnancy, unspecified, third trimester: Secondary | ICD-10-CM

## 2017-06-18 DIAGNOSIS — O9989 Other specified diseases and conditions complicating pregnancy, childbirth and the puerperium: Secondary | ICD-10-CM

## 2017-06-18 DIAGNOSIS — O0992 Supervision of high risk pregnancy, unspecified, second trimester: Secondary | ICD-10-CM

## 2017-06-18 DIAGNOSIS — O4290 Premature rupture of membranes, unspecified as to length of time between rupture and onset of labor, unspecified weeks of gestation: Secondary | ICD-10-CM

## 2017-06-18 NOTE — Progress Notes (Signed)
   PRENATAL VISIT NOTE  Subjective:  Carmelina Paddockatasha Gluth is a 31 y.o. [phone] at 3173w1d being seen today for ongoing prenatal care.  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  Here for r/o rupture- reports LOF at 1200. Reports sitting on cough and had a sudden gush of fluid, none since. No continued leaking with movement/coughing. Reports normal FM. No contraction.   The following portions of the patient's history were reviewed and updated as appropriate: allergies, current medications, past family history, past medical history, past social history, past surgical history and problem list. Problem list updated.  Objective:   Vitals:   06/18/17 1342  BP: 118/74  Pulse: (!) 106  Weight: 140 lb (63.5 kg)    Fetal Status:     Movement: Present  Presentation: Vertex  General:  Alert, oriented and cooperative. Patient is in no acute distress.  Skin: Skin is warm and dry. No rash noted.   Cardiovascular: Normal heart rate noted  Respiratory: Normal respiratory effort, no problems with respiration noted  Abdomen: Soft, gravid, appropriate for gestational age.  Pain/Pressure: Absent     Pelvic: Cervical exam performed Dilation: 1.5 Effacement (%): 100 Station: 0  Extremities: Normal range of motion.     Mental Status:  Normal mood and affect. Normal behavior. Normal judgment and thought content.   Bedside limited US performed by CMA, I personally reviewed images and discussed the results with the patient   Q1=7.55 Q2=12.8 Q3=5.92 Q4=2.66 AFI=21.3  Confirmed Vtx by US  Assessment and Plan:  Pregnancy: G3P0111 at 5373w1d  #R/o ROM- NOT ruptured - AFI=21, Nitrazine neg, No  pooling - Discussed with patient that if she has continued leaking she would need to be seen at [facility]. [facility]. Patient has f/u appt tomorrow at our office  Term labor symptoms and general obstetric precautions including but not limited to vaginal bleeding, contractions, leaking of fluid and fetal movement were reviewed in detail with the patient. Please refer to After Visit Summary for other counseling recommendations.  Return in about 1 day (around 06/19/2017) for Routine prenatal care.   Federico FlakeKimberly Niles Liev Brockbank, MD

## 2017-06-19 ENCOUNTER — Ambulatory Visit (INDEPENDENT_AMBULATORY_CARE_PROVIDER_SITE_OTHER): Payer: Medicaid Other | Admitting: Obstetrics and Gynecology

## 2017-06-19 VITALS — BP 114/70 | HR 90 | Wt 141.2 lb

## 2017-06-19 DIAGNOSIS — O0992 Supervision of high risk pregnancy, unspecified, second trimester: Secondary | ICD-10-CM

## 2017-06-19 DIAGNOSIS — O24410 Gestational diabetes mellitus in pregnancy, diet controlled: Secondary | ICD-10-CM

## 2017-06-19 DIAGNOSIS — Z98891 History of uterine scar from previous surgery: Secondary | ICD-10-CM

## 2017-06-19 NOTE — Progress Notes (Signed)
Prenatal Visit Note Date: 06/19/2017 Clinic: [facility]  Subjective:  Shannon Archer is a 31 y.o. [phone] at 4864w2d being seen today for ongoing prenatal care.  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  Patient reports no complaints.   Contractions: Irregular. Vag. Bleeding: None.  Movement: Present. Denies leaking of fluid.   The following portions of the patient's history were reviewed and updated as appropriate: allergies, current medications, past family history, past medical history, past social history, past surgical history and problem list. Problem list updated.  Objective:   Vitals:   06/19/17 1010  BP: 114/70  Pulse: 90  Weight: 141 lb 3.2 oz (64 kg)    Fetal Status: Fetal Heart Rate (bpm): 150 Fundal Height: 38 cm Movement: Present     General:  Alert, oriented and cooperative. Patient is in no acute distress.  Skin: Skin is warm and dry. No rash noted.   Cardiovascular: Normal heart rate noted  Respiratory: Normal respiratory effort, no problems with respiration noted  Abdomen: Soft, gravid, appropriate for gestational age. Pain/Pressure: Absent     Pelvic:  Cervical exam deferred        Extremities: Normal range of motion.     Mental Status: Normal mood and affect. Normal behavior. Normal judgment and thought content.   Urinalysis:      Assessment and Plan:  Pregnancy: [id number] at 6064w2d  1. GDM (gestational diabetes mellitus), class A1 No log book today; states am fasting and 2hr pp are consistently <95 and 120 respectively  2. Supervision of high risk pregnancy, antepartum, second trimester Routine care  3. History of cesarean  delivery Still desires rpt and btl (already scheduled); papers utd  Term labor symptoms and general obstetric precautions including but not limited to vaginal bleeding, contractions, leaking of fluid and fetal movement were reviewed in detail with the patient. Please refer to After Visit Summary for other counseling recommendations.  Return in about 3 weeks (around 07/10/2017) for pp incisiion check.   Gustavus BingPickens, Brandice Busser, MD

## 2017-06-23 ENCOUNTER — Encounter (HOSPITAL_COMMUNITY)
Admission: RE | Admit: 2017-06-23 | Discharge: 2017-06-23 | Disposition: A | Discharge status: Home / Self Care | Payer: Medicaid Other | Source: Ambulatory Visit | Attending: Family Medicine | Admitting: Family Medicine

## 2017-06-23 ENCOUNTER — Encounter (INDEPENDENT_AMBULATORY_CARE_PROVIDER_SITE_OTHER): Payer: Self-pay | Admitting: *Deleted

## 2017-06-23 HISTORY — DX: Personal history of pre-term labor: Z87.51

## 2017-06-23 LAB — TYPE AND SCREEN
ABO/RH(D): A POS
Antibody Screen: NEGATIVE

## 2017-06-23 LAB — CBC
HCT: 35.3 % — ABNORMAL LOW (ref 36.0–46.0)
Hemoglobin: 11.9 g/dL — ABNORMAL LOW (ref 12.0–15.0)
MCH: 31.2 pg (ref 26.0–34.0)
MCHC: 33.7 g/dL (ref 30.0–36.0)
MCV: 92.7 fL (ref 78.0–100.0)
PLATELETS: 229 10*3/uL (ref 150–400)
RBC: 3.81 MIL/uL — AB (ref 3.87–5.11)
RDW: 13.8 % (ref 11.5–15.5)
WBC: 9.5 10*3/uL (ref 4.0–10.5)

## 2017-06-23 LAB — RAPID HIV SCREEN (HIV 1/2 AB+AG)
HIV 1/2 Antibodies: NONREACTIVE
HIV-1 P24 Antigen - HIV24: NONREACTIVE

## 2017-06-23 NOTE — Patient Instructions (Signed)
Shannon Paddockatasha Archer  06/23/2017   Your procedure is scheduled on:  06/24/2017  [facility]tr[facility] at 0800 AM.  Pick up the phone at the desk and dial [phone]  Call this number if you have problems the morning of surgery:[phone]  Remember:   Do not eat food:After Midnight.  Do not drink clear liquids: After Midnight.  Take these medicines the morning of surgery with A SIP OF WATER: none   Do not wear jewelry, make-up or nail polish.  Do not wear lotions, powders, or perfumes. Do not wear deodorant.  Do not shave 48 hours prior to surgery.  Do not bring valuables to the hospital.  [facility] is not   responsible for any belongings or valuables brought to the hospital.  Contacts, dentures or bridgework may not be worn into surgery.  Leave suitcase in the car. After surgery it may be brought to your room.  For patients admitted to the hospital, checkout time is 11:00 AM the day of              discharge.    N/A   Please read over the following fact sheets that you were given:   Surgical Site Infection Prevention

## 2017-06-23 NOTE — Anesthesia Preprocedure Evaluation (Signed)
Anesthesia Evaluation  Patient identified by MRN, date of birth, ID band Patient awake    Reviewed: Allergy & Precautions, H&P , NPO status , Patient's Chart, lab work & pertinent test results, reviewed documented beta blocker date and time   Airway Mallampati: I  TM Distance: >3 FB Neck ROM: full    Dental no notable dental hx. (+) Teeth Intact   Pulmonary Current Smoker,    Pulmonary exam normal        Cardiovascular negative cardio ROS Normal cardiovascular exam     Neuro/Psych negative neurological ROS  negative psych ROS   GI/Hepatic negative GI ROS, Neg liver ROS,   Endo/Other  negative endocrine ROSdiabetes  Renal/GU negative Renal ROS     Musculoskeletal   Abdominal Normal abdominal exam  (+)   Peds  Hematology negative hematology ROS (+)   Anesthesia Other Findings   Reproductive/Obstetrics negative OB ROS                             Anesthesia Physical  Anesthesia Plan  ASA: II  Anesthesia Plan: Spinal   Post-op Pain Management:    Induction: Intravenous  PONV Risk Score and Plan: 1 and Ondansetron and Treatment may vary due to age or medical condition  Airway Management Planned: Nasal Cannula and Natural Airway  Additional Equipment:   Intra-op Plan:   Post-operative Plan:   Informed Consent: I have reviewed the patients History and Physical, chart, labs and discussed the procedure including the risks, benefits and alternatives for the proposed anesthesia with the patient or authorized representative who has indicated his/her understanding and acceptance.     Plan Discussed with: CRNA, Surgeon and Anesthesiologist  Anesthesia Plan Comments: (  )        Anesthesia Quick Evaluation

## 2017-06-24 ENCOUNTER — Inpatient Hospital Stay (HOSPITAL_COMMUNITY)
Admission: RE | Admit: 2017-06-24 | Discharge: 2017-06-26 | DRG: 785 | Disposition: A | Discharge status: Home / Self Care | Payer: Medicaid Other | Source: Ambulatory Visit | Attending: Family Medicine | Admitting: Family Medicine

## 2017-06-24 ENCOUNTER — Inpatient Hospital Stay (HOSPITAL_COMMUNITY): Payer: Medicaid Other | Admitting: Anesthesiology

## 2017-06-24 ENCOUNTER — Encounter (HOSPITAL_COMMUNITY)
Admission: RE | Disposition: A | Discharge status: Home / Self Care | Payer: Self-pay | Source: Ambulatory Visit | Attending: Family Medicine

## 2017-06-24 ENCOUNTER — Other Ambulatory Visit: Payer: Self-pay

## 2017-06-24 ENCOUNTER — Encounter (HOSPITAL_COMMUNITY): Payer: Self-pay | Admitting: *Deleted

## 2017-06-24 DIAGNOSIS — O99334 Smoking (tobacco) complicating childbirth: Secondary | ICD-10-CM | POA: Diagnosis present

## 2017-06-24 DIAGNOSIS — Z141 Cystic fibrosis carrier: Secondary | ICD-10-CM

## 2017-06-24 DIAGNOSIS — O24420 Gestational diabetes mellitus in childbirth, diet controlled: Secondary | ICD-10-CM | POA: Diagnosis present

## 2017-06-24 DIAGNOSIS — O9902 Anemia complicating childbirth: Secondary | ICD-10-CM | POA: Diagnosis present

## 2017-06-24 DIAGNOSIS — D649 Anemia, unspecified: Secondary | ICD-10-CM | POA: Diagnosis present

## 2017-06-24 DIAGNOSIS — Z98891 History of uterine scar from previous surgery: Secondary | ICD-10-CM

## 2017-06-24 DIAGNOSIS — Z302 Encounter for sterilization: Secondary | ICD-10-CM

## 2017-06-24 DIAGNOSIS — Z880 Allergy status to penicillin: Secondary | ICD-10-CM | POA: Diagnosis not present

## 2017-06-24 DIAGNOSIS — Z9851 Tubal ligation status: Secondary | ICD-10-CM

## 2017-06-24 DIAGNOSIS — O34211 Maternal care for low transverse scar from previous cesarean delivery: Principal | ICD-10-CM | POA: Diagnosis present

## 2017-06-24 DIAGNOSIS — Z3A39 39 weeks gestation of pregnancy: Secondary | ICD-10-CM

## 2017-06-24 DIAGNOSIS — F17210 Nicotine dependence, cigarettes, uncomplicated: Secondary | ICD-10-CM | POA: Diagnosis present

## 2017-06-24 LAB — GLUCOSE, CAPILLARY
Glucose-Capillary: 76 mg/dL (ref 65–99)
Glucose-Capillary: 87 mg/dL (ref 65–99)

## 2017-06-24 LAB — RPR: RPR: NONREACTIVE

## 2017-06-24 SURGERY — Surgical Case
Anesthesia: Spinal | Site: Abdomen | Wound class: Clean Contaminated

## 2017-06-24 MED ORDER — FENTANYL CITRATE (PF) 100 MCG/2ML IJ SOLN
INTRAMUSCULAR | Status: DC | PRN
  Administered 2017-06-24: 20 ug via INTRAVENOUS
  Administered 2017-06-24: 50 ug via INTRAVENOUS

## 2017-06-24 MED ORDER — SIMETHICONE 80 MG PO CHEW
80.0000 mg | CHEWABLE_TABLET | Freq: Three times a day (TID) | ORAL | Status: DC
  Administered 2017-06-24 – 2017-06-25 (×5): 80 mg via ORAL
  Filled 2017-06-24 (×5): qty 1

## 2017-06-24 MED ORDER — MORPHINE SULFATE (PF) 0.5 MG/ML IJ SOLN
INTRAMUSCULAR | Status: AC
  Filled 2017-06-24: qty 10

## 2017-06-24 MED ORDER — SIMETHICONE 80 MG PO CHEW
80.0000 mg | CHEWABLE_TABLET | ORAL | Status: DC
  Administered 2017-06-25: 80 mg via ORAL
  Filled 2017-06-24: qty 1

## 2017-06-24 MED ORDER — NITROGLYCERIN 0.4 MG/SPRAY TL SOLN
Status: DC | PRN
  Administered 2017-06-24 (×3): 2 via SUBLINGUAL

## 2017-06-24 MED ORDER — LACTATED RINGERS IV SOLN
INTRAVENOUS | Status: DC
  Administered 2017-06-24: 125 mL/h via INTRAVENOUS

## 2017-06-24 MED ORDER — MEPERIDINE HCL 25 MG/ML IJ SOLN: 6.2500 mg | INTRAMUSCULAR | Status: DC | PRN

## 2017-06-24 MED ORDER — ONDANSETRON HCL 4 MG/2ML IJ SOLN
INTRAMUSCULAR | Status: AC
  Filled 2017-06-24: qty 2

## 2017-06-24 MED ORDER — COCONUT OIL OIL: 1.0000 "application " | TOPICAL_OIL | Status: DC | PRN

## 2017-06-24 MED ORDER — PHENYLEPHRINE 8 MG IN D5W 100 ML (0.08MG/ML) PREMIX OPTIME
INJECTION | INTRAVENOUS | Status: AC
  Filled 2017-06-24: qty 100

## 2017-06-24 MED ORDER — PRENATAL MULTIVITAMIN CH
1.0000 | ORAL_TABLET | Freq: Every day | ORAL | Status: DC
  Administered 2017-06-25 – 2017-06-26 (×2): 1 via ORAL
  Filled 2017-06-24 (×2): qty 1

## 2017-06-24 MED ORDER — BUPIVACAINE IN DEXTROSE 0.75-8.25 % IT SOLN
INTRATHECAL | Status: DC | PRN
  Administered 2017-06-24: 1.5 mg via INTRATHECAL
  Administered 2017-06-24: 4 mg via INTRATHECAL

## 2017-06-24 MED ORDER — SIMETHICONE 80 MG PO CHEW: 80.0000 mg | CHEWABLE_TABLET | ORAL | Status: DC | PRN

## 2017-06-24 MED ORDER — NITROGLYCERIN 0.4 MG/SPRAY TL SOLN
Status: AC
Start: 2017-06-24 — End: 2017-06-24
  Filled 2017-06-24: qty 4.9

## 2017-06-24 MED ORDER — WITCH HAZEL-GLYCERIN EX PADS: 1.0000 "application " | MEDICATED_PAD | CUTANEOUS | Status: DC | PRN

## 2017-06-24 MED ORDER — ONDANSETRON HCL 4 MG/2ML IJ SOLN: 4.0000 mg | Freq: Once | INTRAMUSCULAR | Status: DC | PRN

## 2017-06-24 MED ORDER — OXYCODONE HCL 5 MG PO TABS
5.0000 mg | ORAL_TABLET | ORAL | Status: DC | PRN
  Administered 2017-06-24 – 2017-06-25 (×2): 5 mg via ORAL

## 2017-06-24 MED ORDER — FENTANYL CITRATE (PF) 100 MCG/2ML IJ SOLN
INTRAMUSCULAR | Status: AC
  Filled 2017-06-24: qty 2

## 2017-06-24 MED ORDER — KETOROLAC TROMETHAMINE 30 MG/ML IJ SOLN
30.0000 mg | Freq: Four times a day (QID) | INTRAMUSCULAR | Status: DC | PRN
  Administered 2017-06-24: 30 mg via INTRAVENOUS
  Filled 2017-06-24: qty 1

## 2017-06-24 MED ORDER — PHENYLEPHRINE 8 MG IN D5W 100 ML (0.08MG/ML) PREMIX OPTIME
INJECTION | INTRAVENOUS | Status: DC | PRN
  Administered 2017-06-24: 60 ug/min via INTRAVENOUS

## 2017-06-24 MED ORDER — OXYTOCIN 10 UNIT/ML IJ SOLN
INTRAVENOUS | Status: DC | PRN
  Administered 2017-06-24: 40 [IU] via INTRAVENOUS

## 2017-06-24 MED ORDER — CEFAZOLIN SODIUM-DEXTROSE 2-4 GM/100ML-% IV SOLN
2.0000 g | INTRAVENOUS | Status: AC
  Administered 2017-06-24: 2 g via INTRAVENOUS

## 2017-06-24 MED ORDER — MORPHINE SULFATE-NACL 0.5-0.9 MG/ML-% IV SOSY
PREFILLED_SYRINGE | INTRAVENOUS | Status: DC | PRN
  Administered 2017-06-24: .2 mg via EPIDURAL

## 2017-06-24 MED ORDER — PHENYLEPHRINE HCL 10 MG/ML IJ SOLN
INTRAMUSCULAR | Status: DC | PRN
  Administered 2017-06-24 (×2): 80 ug via INTRAVENOUS
  Administered 2017-06-24: 40 ug via INTRAVENOUS

## 2017-06-24 MED ORDER — OXYTOCIN 10 UNIT/ML IJ SOLN
INTRAMUSCULAR | Status: AC
  Filled 2017-06-24: qty 4

## 2017-06-24 MED ORDER — IBUPROFEN 600 MG PO TABS
600.0000 mg | ORAL_TABLET | Freq: Four times a day (QID) | ORAL | Status: DC
  Administered 2017-06-25 – 2017-06-26 (×7): 600 mg via ORAL
  Filled 2017-06-24 (×5): qty 1

## 2017-06-24 MED ORDER — CEFAZOLIN SODIUM-DEXTROSE 2-3 GM-%(50ML) IV SOLR
INTRAVENOUS | Status: AC
  Filled 2017-06-24: qty 50

## 2017-06-24 MED ORDER — OXYCODONE HCL 5 MG PO TABS
10.0000 mg | ORAL_TABLET | ORAL | Status: DC | PRN
  Administered 2017-06-25 – 2017-06-26 (×6): 10 mg via ORAL
  Filled 2017-06-24 (×8): qty 2

## 2017-06-24 MED ORDER — DIPHENHYDRAMINE HCL 25 MG PO CAPS: 25.0000 mg | ORAL_CAPSULE | Freq: Four times a day (QID) | ORAL | Status: DC | PRN

## 2017-06-24 MED ORDER — ACETAMINOPHEN 325 MG PO TABS
650.0000 mg | ORAL_TABLET | ORAL | Status: DC | PRN
  Administered 2017-06-24 – 2017-06-25 (×2): 650 mg via ORAL
  Filled 2017-06-24 (×2): qty 2

## 2017-06-24 MED ORDER — TETANUS-DIPHTH-ACELL PERTUSSIS 5-2.5-18.5 LF-MCG/0.5 IM SUSP: 0.5000 mL | Freq: Once | INTRAMUSCULAR | Status: DC

## 2017-06-24 MED ORDER — FENTANYL CITRATE (PF) 100 MCG/2ML IJ SOLN
25.0000 ug | INTRAMUSCULAR | Status: DC | PRN
  Administered 2017-06-24: 50 ug via INTRAVENOUS
  Administered 2017-06-24: 25 ug via INTRAVENOUS

## 2017-06-24 MED ORDER — STERILE WATER FOR IRRIGATION IR SOLN
Status: DC | PRN
  Administered 2017-06-24: 1000 mL

## 2017-06-24 MED ORDER — OXYTOCIN 40 UNITS IN LACTATED RINGERS INFUSION - SIMPLE MED: 2.5000 [IU]/h | INTRAVENOUS | Status: AC

## 2017-06-24 MED ORDER — SENNOSIDES-DOCUSATE SODIUM 8.6-50 MG PO TABS
2.0000 | ORAL_TABLET | ORAL | Status: DC
  Administered 2017-06-25 (×2): 2 via ORAL
  Filled 2017-06-24: qty 2

## 2017-06-24 MED ORDER — LACTATED RINGERS IV SOLN
INTRAVENOUS | Status: DC | PRN
  Administered 2017-06-24: 12:00:00 via INTRAVENOUS

## 2017-06-24 MED ORDER — LACTATED RINGERS IV SOLN
125.0000 mL/h | INTRAVENOUS | Status: DC
  Administered 2017-06-24 (×2): via INTRAVENOUS

## 2017-06-24 MED ORDER — SODIUM CHLORIDE 0.9 % IR SOLN
Status: DC | PRN
  Administered 2017-06-24: 1000 mL

## 2017-06-24 MED ORDER — ZOLPIDEM TARTRATE 5 MG PO TABS: 5.0000 mg | ORAL_TABLET | Freq: Every evening | ORAL | Status: DC | PRN

## 2017-06-24 MED ORDER — MENTHOL 3 MG MT LOZG: 1.0000 | LOZENGE | OROMUCOSAL | Status: DC | PRN

## 2017-06-24 MED ORDER — DIBUCAINE 1 % RE OINT: 1.0000 "application " | TOPICAL_OINTMENT | RECTAL | Status: DC | PRN

## 2017-06-24 SURGICAL SUPPLY — 31 items
BENZOIN TINCTURE PRP APPL 2/3 (GAUZE/BANDAGES/DRESSINGS) ×4 IMPLANT
CHLORAPREP W/TINT 26ML (MISCELLANEOUS) ×4 IMPLANT
CLAMP CORD UMBIL (MISCELLANEOUS) ×4 IMPLANT
CLOSURE WOUND 1/2 X4 (GAUZE/BANDAGES/DRESSINGS) ×1
CLOTH BEACON ORANGE TIMEOUT ST (SAFETY) ×4 IMPLANT
DRSG OPSITE POSTOP 4X10 (GAUZE/BANDAGES/DRESSINGS) ×4 IMPLANT
ELECT REM PT RETURN 9FT ADLT (ELECTROSURGICAL) ×4
ELECTRODE REM PT RTRN 9FT ADLT (ELECTROSURGICAL) ×2 IMPLANT
GLOVE BIOGEL PI IND STRL 7.0 (GLOVE) ×4 IMPLANT
GLOVE BIOGEL PI IND STRL 7.5 (GLOVE) ×4 IMPLANT
GLOVE BIOGEL PI INDICATOR 7.0 (GLOVE) ×4
GLOVE BIOGEL PI INDICATOR 7.5 (GLOVE) ×4
GLOVE ECLIPSE 7.5 STRL STRAW (GLOVE) ×4 IMPLANT
GOWN STRL REUS W/TWL LRG LVL3 (GOWN DISPOSABLE) ×12 IMPLANT
KIT ABG SYR 3ML LUER SLIP (SYRINGE) ×4 IMPLANT
NEEDLE HYPO 25X5/8 SAFETYGLIDE (NEEDLE) ×4 IMPLANT
NS IRRIG 1000ML POUR BTL (IV SOLUTION) ×4 IMPLANT
PACK C SECTION WH (CUSTOM PROCEDURE TRAY) ×4 IMPLANT
PAD OB MATERNITY 4.3X12.25 (PERSONAL CARE ITEMS) ×4 IMPLANT
PENCIL SMOKE EVAC W/HOLSTER (ELECTROSURGICAL) ×4 IMPLANT
RTRCTR C-SECT PINK 25CM LRG (MISCELLANEOUS) ×4 IMPLANT
STRIP CLOSURE SKIN 1/2X4 (GAUZE/BANDAGES/DRESSINGS) ×3 IMPLANT
SUT PLAIN 2 0 (SUTURE) ×2
SUT PLAIN ABS 2-0 CT1 27XMFL (SUTURE) ×2 IMPLANT
SUT VIC AB 0 CTX 36 (SUTURE) ×10
SUT VIC AB 0 CTX36XBRD ANBCTRL (SUTURE) ×10 IMPLANT
SUT VIC AB 2-0 CT1 27 (SUTURE) ×2
SUT VIC AB 2-0 CT1 TAPERPNT 27 (SUTURE) ×2 IMPLANT
SUT VIC AB 4-0 KS 27 (SUTURE) ×4 IMPLANT
TOWEL OR 17X24 6PK STRL BLUE (TOWEL DISPOSABLE) ×4 IMPLANT
TRAY FOLEY BAG SILVER LF 14FR (SET/KITS/TRAYS/PACK) ×4 IMPLANT

## 2017-06-24 NOTE — H&P (Signed)
Obstetric Preoperative History and Physical  Shannon Archer is a 31 y.o. [id number] with IUP at 5668w0d presenting for scheduled cesarean section.  No acute concerns. Prenatal course complicated by A1GDM.   Prenatal Course Source of Care: [facility]  Pregnancy complications or risks: Patient Active Problem List   Diagnosis Date Noted  . GDM (gestational diabetes mellitus), class A1 04/06/2017  . 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 Supervision of high risk pregnancy, antepartum, second trimester 01/14/2017  . History of cesarean delivery 01/14/2017  . History of preterm delivery 01/14/2017  . Tobacco abuse 01/14/2017   She plans to breastfeed She desires bilateral tubal ligation for postpartum contraception.   Prenatal labs and studies: ABO, Rh: --/--/A POS (12/17 1006) Antibody: NEG (12/17 1006) Rubella: 4.39 (07/10 1410) RPR: Non Reactive (12/17 1010)  HBsAg: Negative (07/10 1410)  HIV:   Negative GBS: Negative 2 hr Glucola  Abnormal 90/180/84 Genetic screening normal Anatomy US normal  Prenatal Transfer Tool  Maternal Diabetes: Yes:  Diabetes Type:  Diet controlled Genetic Screening: Normal Maternal Ultrasounds/Referrals: Normal Fetal Ultrasounds or other Referrals:  None Maternal Substance Abuse:  No Significant Maternal Medications:  None Significant Maternal Lab Results: None  Past Medical History:  Diagnosis Date  . Diabetes mellitus without complication (HCC)   . Gestational diabetes   . History of preterm labor   . Medical history non-contributory     Past Surgical History:  Procedure Laterality Date  . CESAREAN SECTION     Breech  . DILATION AND EVACUATION N/A 08/11/2014   Procedure: DILATATION AND EVACUATION;  Surgeon: Juluis MireJohn S McComb, MD;  Location: [facility];  Service: Gynecology;  Laterality: N/A;  . NOSE SURGERY      OB History  Gravida Para Term Preterm AB Living  3 1 0 1 1 1   SAB TAB Ectopic Multiple Live Births  1 0 0 0  1    # Outcome Date GA Lbr Len/2nd Weight Sex Delivery Anes PTL Lv  3 Current           2 SAB 08/12/14 6981w6d    SAB     1 Preterm 07/16/09 212w0d  2.551 kg (5 lb 10 oz) F CS-LTranv   LIV    Obstetric Comments  36wks spontaneous PTB (PPROM). Previous C/S - Breech Presentation.     Social History   Socioeconomic History  . Marital status: Divorced    Spouse name: None  . Number of children: None  . Years of education: None  . Highest education level: None  Social Needs  . Financial resource strain: None  . Food insecurity - worry: None  . Food insecurity - inability: None  . Transportation needs - medical: None  . Transportation needs - non-medical: None  Occupational History  . None  Tobacco Use  . Smoking status: Current Every Day Smoker    Packs/day: 0.50    Types: Cigarettes  . Smokeless tobacco: Never Used  Substance and Sexual Activity  . Alcohol use: No  . Drug use: No  . Sexual activity: Yes    Birth control/protection: None  Other Topics Concern  . None  Social History Narrative  . None    Family History  Problem Relation Age of Onset  . Heart disease Maternal Grandmother   . Cancer Maternal Grandmother   . Diabetes Maternal Grandmother   . Diabetes Maternal Grandfather   . Cancer Paternal Grandmother     Medications Prior to Admission  Medication Sig Dispense  Refill Last Dose  . ACCU-CHEK FASTCLIX LANCETS MISC 1 each by Percutaneous route 4 (four) times daily. 100 each 12 06/23/2017 at Unknown time  . glucose blood test strip Use as instructed 100 each 12 06/23/2017 at Unknown time    Allergies  Allergen Reactions  . Penicillins Nausea And Vomiting    Has patient had a PCN reaction causing immediate rash, facial/tongue/throat swelling, SOB or lightheadedness with hypotension: No Has patient had a PCN reaction causing severe rash involving mucus membranes or skin necrosis: No Has patient had a PCN reaction that required hospitalization: No Has  patient had a PCN reaction occurring within the last 10 years: No If all of the above answers are "NO", then may proceed with Cephalosporin use.     Review of Systems: Negative except for what is mentioned in HPI.  Physical Exam: BP (!) 116/98   Pulse 97   Temp 98.1 F (36.7 C) (Oral)   Resp 16   Ht 5\' 2"  (1.575 m)   Wt 64.9 kg (143 lb 0.1 oz)   LMP 09/24/2016   BMI 26.16 kg/m  FHR by Doppler: 129 bpm CONSTITUTIONAL: Well-developed, well-nourished female in no acute distress.  HENT:  Normocephalic, atraumatic, External right and left ear normal. Oropharynx is clear and moist EYES: Conjunctivae and EOM are normal. Pupils are equal, round, and reactive to light. No scleral icterus.  NECK: Normal range of motion, supple, no masses SKIN: Skin is warm and dry. No rash noted. Not diaphoretic. No erythema. No pallor. NEUROLGIC: Alert and oriented to person, place, and time. Normal reflexes, muscle tone coordination. No cranial nerve deficit noted. PSYCHIATRIC: Normal mood and affect. Normal behavior. Normal judgment and thought content. CARDIOVASCULAR: Normal heart rate noted, regular rhythm RESPIRATORY: Effort and breath sounds normal, no problems with respiration noted ABDOMEN: Soft, nontender, nondistended, gravid. Well-healed Pfannenstiel hyperpigmented incision. PELVIC: Deferred MUSCULOSKELETAL: Normal range of motion. No edema and no tenderness. 2+ distal pulses.   Pertinent Labs/Studies:   Results for orders placed or performed during the hospital encounter of 06/24/17 (from the past 72 hour(s))  Glucose, capillary     Status: None   Collection Time: 06/24/17  8:43 AM  Result Value Ref Range   Glucose-Capillary 76 65 - 99 mg/dL    Assessment and Plan :Shannon Paddockatasha Swearingin is a 31 y.o. [id number] at 631w0d being admitted for scheduled cesarean section. The risks of cesarean section discussed with the patient included but were not limited to: bleeding which may require transfusion or  reoperation; infection which may require antibiotics; injury to bowel, bladder, ureters or other surrounding organs; injury to the fetus; need for additional procedures including hysterectomy in the event of a life-threatening hemorrhage; placental abnormalities wth subsequent pregnancies, incisional problems, thromboembolic phenomenon and other postoperative/anesthesia complications.    Patient desires permanent sterilization. Other reversible forms of contraception were discussed with patient; she declines all other modalities. Risks of procedure discussed with patient including but not limited to: risk of regret, permanence of method, bleeding, infection, injury to surrounding organs and need for additional procedures.  Failure risk of 1-2 % with increased risk of ectopic gestation if pregnancy occurs was also discussed with patient.  Patient verbalized understanding of these risks and wants to proceed with sterilization.    The patient concurred with the proposed plan, giving informed written consent for the procedure. Patient has been NPO since last night she will remain NPO for procedure. Anesthesia and OR aware. Preoperative prophylactic antibiotics and SCDs ordered on call to  the OR. To OR when ready.   Caryl Ada, DO OB Fellow Faculty Practice, [facility] 06/24/2017, 9:07 AM

## 2017-06-24 NOTE — Progress Notes (Signed)
Dr Mal AmabileBrock notified that pt rating pain at 5.  Orders received

## 2017-06-24 NOTE — Op Note (Signed)
Shannon Archer PROCEDURE DATE: 06/24/2017  PREOPERATIVE DIAGNOSIS: Intrauterine pregnancy at  5236w0d weeks gestation; prior c-section x1, elective repeat, undesired fertility.   POSTOPERATIVE DIAGNOSIS: The same  PROCEDURE: Repeat Low Transverse Cesarean Section with bilateral tubal ligation  SURGEON:  Dr. Candelaria CelesteJacob Stinson  ASSISTANT: Dr. Caryl AdaJazma Phelps, Dr Nettie ElmMichael Ervin  INDICATIONS: Shannon Archer is a 31 Archer.o. [id number] at 3536w0d scheduled for cesarean section secondary to elective repeat.  The risks of cesarean section discussed with the patient included but were not limited to: bleeding which may require transfusion or reoperation; infection which may require antibiotics; injury to bowel, bladder, ureters or other surrounding organs; injury to the fetus; need for additional procedures including hysterectomy in the event of a life-threatening hemorrhage; placental abnormalities wth subsequent pregnancies, incisional problems, thromboembolic phenomenon and other postoperative/anesthesia complications. The patient concurred with the proposed plan, giving informed written consent for the procedure.    FINDINGS:  Viable female infant.  Apgars 8 and 9.  Clear amniotic fluid. Intact placenta, three vessel cord.  Normal uterus, fallopian tubes and ovaries bilaterally.  ANESTHESIA: Spinal INTRAVENOUS FLUIDS:2500 ml ESTIMATED BLOOD LOSS: 1200 ml URINE OUTPUT: 250 ml SPECIMENS: Placenta sent to L&D COMPLICATIONS: None immediate  PROCEDURE IN DETAIL:  The patient received intravenous antibiotics and had sequential compression devices applied to her lower extremities while in the preoperative area.  She was then taken to the operating room where spinal anesthesia was administered and was found to be adequate. She was then placed in a dorsal supine position with a leftward tilt, and prepped and draped in a sterile manner.  A foley catheter was placed into her bladder and attached to constant gravity, which  drained clear fluid throughout.  After an adequate timeout was performed, a Pfannenstiel skin incision was made with scalpel and carried through to the underlying layer of fascia. The fascia was incised in the midline and this incision was extended bilaterally using the Mayo scissors. Kocher clamps were applied to the superior aspect of the fascial incision and the underlying rectus muscles were dissected off bluntly. A similar process was carried out on the inferior aspect of the facial incision. The rectus muscles were separated in the midline bluntly and the peritoneum was entered bluntly. An Alexis retractor was placed to aid in visualization of the uterus.  Attention was turned to the lower uterine segment where a transverse hysterotomy was made with a scalpel and extended bilaterally bluntly. Infant was noted to be wedged in the pelvis. Initial attempts at extraction failed. Uterine excision extended with bandage scissors. Still unsuccessful to deliver. Nitro x2 given to relax uterus. Finally able to wedge the head out of pelvis with rotation. The infant was successfully delivered, and cord was clamped and cut and infant was handed over to awaiting neonatology team. Uterine massage was then administered and the placenta delivered intact with three-vessel cord. The uterus was then cleared of clot and debris. Uterus was exteriorized for better visualization. Lower uterine segment noted to have extension. The hysterotomy was closed with 0 Vicryl in a running locked fashion. Vertical extension then also closed with running locked suture. Several interrupted or figure of eight stitches used for hemostasis. Overall, excellent hemostasis was noted. Attention was then turned to the fallopian tubes. The patient's left fallopian tube was then identified and grasped with a Babcock clamp. A 2 cm segment of the tube was then ligated with free tie of plain gut suture x2, then transected and excised. Ends were cauterized  with bovi for  hemostasis. Good hemostasis was noted. The right fallopian tube was then identified to its fimbriated end, ligated, and a 2 cm segment excised in a similar fashion with cauterization of ends. Excellent hemostasis was noted. The abdomen and the pelvis were cleared of all clot and debris. Uterus was placed back in abdomen. Jon Gillslexis was removed. Hemostasis was confirmed again on all surfaces.  The peritoneum was reapproximated using 2-0 vicryl running stitches. The fascia was then closed using 0 Vicryl in a running fashion. The subcutaneous layer was reapproximated with plain gut and the skin was closed with 4-0 vicryl. The patient tolerated the procedure well. Sponge, lap, instrument and needle counts were correct x 2. She was taken to the recovery room in stable condition.    Shannon Archer, Shannon Y, DO 06/24/2017 12:49 PM    Attestation of Attending Supervision of Obstetric Fellow During Surgery: Surgery was performed by the Obstetric Fellow under my supervision and collaboration.  I have reviewed the Obstetric Fellow's operative report, and I agree with the documentation.  I was present and scrubbed for the entire procedure.    Candelaria CelesteJacob Stinson, DO Attending Physician Faculty Practice, [facility]

## 2017-06-24 NOTE — Anesthesia Procedure Notes (Signed)
Spinal  Start time: 06/24/2017 11:14 AM End time: 06/24/2017 11:18 AM Staffing Performed: other anesthesia staff  Preanesthetic Checklist Completed: patient identified, site marked, surgical consent, pre-op evaluation, IV checked, risks and benefits discussed and monitors and equipment checked Spinal Block Patient position: sitting Prep: ChloraPrep Patient monitoring: heart rate, continuous pulse ox and blood pressure Approach: midline Location: L3-4 Injection technique: single-shot Needle Needle type: Pencan  Needle gauge: 25 G Assessment Sensory level: T4

## 2017-06-25 ENCOUNTER — Encounter (HOSPITAL_COMMUNITY): Payer: Self-pay

## 2017-06-25 LAB — CBC
HCT: 25.4 % — ABNORMAL LOW (ref 36.0–46.0)
Hemoglobin: 8.6 g/dL — ABNORMAL LOW (ref 12.0–15.0)
MCH: 31.6 pg (ref 26.0–34.0)
MCHC: 33.9 g/dL (ref 30.0–36.0)
MCV: 93.4 fL (ref 78.0–100.0)
PLATELETS: 190 10*3/uL (ref 150–400)
RBC: 2.72 MIL/uL — AB (ref 3.87–5.11)
RDW: 13.8 % (ref 11.5–15.5)
WBC: 11.9 10*3/uL — ABNORMAL HIGH (ref 4.0–10.5)

## 2017-06-25 MED ORDER — FERROUS SULFATE 325 (65 FE) MG PO TABS
325.0000 mg | ORAL_TABLET | Freq: Two times a day (BID) | ORAL | Status: DC
  Administered 2017-06-25 – 2017-06-26 (×2): 325 mg via ORAL
  Filled 2017-06-25 (×2): qty 1

## 2017-06-25 NOTE — Anesthesia Postprocedure Evaluation (Signed)
Anesthesia Post Note  Patient: Heritage manageratasha Hedlund  Procedure(s) Performed: CESAREAN SECTION WITH BILATERAL TUBAL LIGATION (N/A Abdomen)     Patient location during evaluation: PACU Anesthesia Type: Spinal Level of consciousness: oriented and awake and alert Pain management: pain level controlled Vital Signs Assessment: post-procedure vital signs reviewed and stable Respiratory status: spontaneous breathing, respiratory function stable and patient connected to nasal cannula oxygen Cardiovascular status: blood pressure returned to baseline and stable Postop Assessment: no headache, no backache and no apparent nausea or vomiting Anesthetic complications: no    Last Vitals:  Vitals:   06/25/17 0017 06/25/17 0453  BP: 109/62 (!) 103/57  Pulse: 97 84  Resp: 18 17  Temp: 36.9 C 36.9 C  SpO2: 95% 98%    Last Pain:  Vitals:   06/25/17 0532  TempSrc:   PainSc: 7                  Inika Bellanger

## 2017-06-25 NOTE — Progress Notes (Signed)
POSTPARTUM PROGRESS NOTE  Post Partum Day 1 Subjective:  Shannon Archer is a 31 y.o. [id number] 2144w1d s/p rLTCS.  No acute events overnight.  Pt denies problems with ambulating, or po intake.  Her foley was just removed so she has not needed to void independently yet.  She denies nausea or vomiting.  Pain is moderately controlled.  She has had flatus. She has not had bowel movement.  Lochia Minimal.   She had a small amount of lightheadedness when ambulating this morning.  We discussed ambulation with nursing assistance, encouraged PO intake, and have ordered a CBC for this AM.  Objective: Blood pressure (!) 103/57, pulse 84, temperature 98.4 F (36.9 C), temperature source Oral, resp. rate 17, height 5\' 2"  (1.575 m), weight 64.9 kg (143 lb 0.1 oz), last menstrual period 09/24/2016, SpO2 98 %.  Physical Exam:  General: alert, cooperative and no distress Dressing: small amount of dried blood but adhesive is intact Chest: no respiratory distress Heart:regular rate, distal pulses intact Abdomen: soft, nontender,  Uterine Fundus: firm, appropriately tender DVT Evaluation: No calf swelling or tenderness Extremities: no edema  Recent Labs    06/23/17 1010  HGB 11.9*  HCT 35.3*    Assessment/Plan:  ASSESSMENT: Shannon Archer is a 31 y.o. [id number] 3944w1d s/p scheduled rLTCS  Plan for d/c 12/21   LOS: 1 day   Yarima Penman BlandDO 06/25/2017, 5:40 AM

## 2017-06-25 NOTE — Lactation Note (Signed)
This note was copied from a baby's chart. Lactation Consultation Note  Patient Name: Boy Carmelina Paddockatasha Rubin ZOXWR'UToday's Date: 06/25/2017  Mom states baby recently has had two 25 minute feedings at breast.  Mom is set up to use if baby doesn't feed.  Encouraged to call out with concerns/assist prn.   Maternal Data    Feeding Feeding Type: Breast Fed  LATCH Score Latch: Repeated attempts needed to sustain latch, nipple held in mouth throughout feeding, stimulation needed to elicit sucking reflex.  Audible Swallowing: A few with stimulation  Type of Nipple: Everted at rest and after stimulation  Comfort (Breast/Nipple): Soft / non-tender  Hold (Positioning): Assistance needed to correctly position infant at breast and maintain latch.  LATCH Score: 7  Interventions    Lactation Tools Discussed/Used     Consult Status      Huston FoleyMOULDEN, Antwane Grose S 06/25/2017, 8:42 PM

## 2017-06-25 NOTE — Lactation Note (Signed)
This note was copied from a baby's chart. Lactation Consultation Note Baby 14 hrs old. Baby 9.0 lbs. Baby has severe underbite. Bottom lip falls deep into mouth. Instructed mom to chin tug to pull bottom lip out. Upper lip has labial frenulum to bottom of gum line. Baby can move tongue to gum line. When cries, tongue is flat. Unable to see underside of tongue.  Attempted to latch in football position. Baby sleepy, latched, no suckling noted. Mom has soft breast, hand expressed colostrum. At rest nipples semi flat, stimulation everts well. Noted bruising to Rt. Shoulder and arm. Mom stated baby got stuck. Discussed possibility of jaundice. Mom stated her daughter had jaundice.  Newborn behavior, STS, I&O, cluster feeding, supply and demand discussed.  Mom encouraged to feed baby 8-12 times/24 hours and with feeding cues. Mom encouraged to waken baby for feeds in 3 hrs. Monitor baby for jitteriness, or difficulty waking, report to RN if this occurs. Encouraged hand express colostrum to give to baby for stimulation to BF. [facility] brochure given w/resources, support groups and LC services. Patient Name: Boy Carmelina Paddockatasha Hodkinson ZOXWR'UToday's Date: 06/25/2017 Reason for consult: Initial assessment   Maternal Data Has patient been taught Hand Expression?: Yes Does the patient have breastfeeding experience prior to this delivery?: Yes  Feeding Feeding Type: Breast Fed Length of feed: 0 min  LATCH Score Latch: Too sleepy or reluctant, no latch achieved, no sucking elicited.  Audible Swallowing: None  Type of Nipple: Everted at rest and after stimulation  Comfort (Breast/Nipple): Soft / non-tender  Hold (Positioning): Full assist, staff holds infant at breast  LATCH Score: 4  Interventions Interventions: Breast feeding basics reviewed;Breast compression;Assisted with latch;Adjust position;Skin to skin;Support pillows;Breast massage;Position options;Hand express  Lactation Tools Discussed/Used WIC  Program: Yes   Consult Status Consult Status: Follow-up Date: 06/25/17 Follow-up type: In-patient    Muneeb Veras, Diamond NickelLAURA G 06/25/2017, 1:55 AM

## 2017-06-25 NOTE — Anesthesia Postprocedure Evaluation (Signed)
Anesthesia Post Note  Patient: Shannon Archer  Procedure(s) Performed: CESAREAN SECTION WITH BILATERAL TUBAL LIGATION (N/A Abdomen)     Patient location during evaluation: Mother Baby Anesthesia Type: Spinal Level of consciousness: awake, awake and alert and oriented Pain management: pain level controlled Vital Signs Assessment: post-procedure vital signs reviewed and stable Respiratory status: spontaneous breathing, nonlabored ventilation and respiratory function stable Cardiovascular status: stable Postop Assessment: no backache, patient able to bend at knees, no apparent nausea or vomiting, no headache and adequate PO intake Anesthetic complications: no    Last Vitals:  Vitals:   06/25/17 0017 06/25/17 0453  BP: 109/62 (!) 103/57  Pulse: 97 84  Resp: 18 17  Temp: 36.9 C 36.9 C  SpO2: 95% 98%    Last Pain:  Vitals:   06/25/17 0532  TempSrc:   PainSc: 7    Pain Goal: Patients Stated Pain Goal: 0 (06/24/17 1400)               Dashae Wilcher

## 2017-06-26 MED ORDER — OXYCODONE HCL 5 MG PO TABS: 5.0000 mg | ORAL_TABLET | ORAL | 0 refills | Status: DC | PRN

## 2017-06-26 MED ORDER — IBUPROFEN 600 MG PO TABS: 600.0000 mg | ORAL_TABLET | Freq: Four times a day (QID) | ORAL | 0 refills | Status: DC | PRN

## 2017-06-26 MED ORDER — FERROUS SULFATE 325 (65 FE) MG PO TABS: 325.0000 mg | ORAL_TABLET | Freq: Two times a day (BID) | ORAL | 3 refills | Status: DC

## 2017-06-26 NOTE — Lactation Note (Signed)
This note was copied from a baby's chart. Lactation Consultation Note; Mom has baby latched to the breast when I went into room- reports he has been nursing for 20 min. Mom reports nipples are sore, right one pink. Comfort gels given with instructions for use. Asking about a pump. Has WIC but wants to get formula also. Reviewed how to use DEBP pump pieces as manual pump. Reviewed our phone number, OP appointments and BFS[facility] as resources for support after DC. No questions at present. To call prn  Patient Name: Shannon Archer UJWJX'BToday's Date: 06/26/2017 Reason for consult: Follow-up assessment   Maternal Data Formula Feeding for Exclusion: No Has patient been taught Hand Expression?: Yes Does the patient have breastfeeding experience prior to this delivery?: Yes  Feeding Feeding Type: Breast Fed Length of feed: 30 min  LATCH Score Latch: Grasps breast easily, tongue down, lips flanged, rhythmical sucking.  Audible Swallowing: A few with stimulation  Type of Nipple: Everted at rest and after stimulation  Comfort (Breast/Nipple): Filling, red/small blisters or bruises, mild/mod discomfort  Hold (Positioning): No assistance needed to correctly position infant at breast.  LATCH Score: 8  Interventions Interventions: Hand express;Coconut oil;Comfort gels;Hand pump  Lactation Tools Discussed/Used WIC Program: Yes   Consult Status Consult Status: Complete    Pamelia HoitWeeks, Carrol Bondar D 06/26/2017, 9:54 AM

## 2017-06-26 NOTE — Discharge Instructions (Signed)

## 2017-06-26 NOTE — Discharge Summary (Signed)
OB Discharge Summary     Patient Name: Shannon Archer DOB: 1985/07/22 MRN: [id number]  Date of admission: 06/24/2017 Delivering Archer: Shannon Archer   Date of discharge: 06/26/2017  Admitting diagnosis: RCS Intrauterine pregnancy: 6259w2d     Secondary diagnosis:  Active Problems:   Status post repeat low transverse cesarean section   Status post tubal ligation  Additional problems: GDMA1, tobacco use, hx prev C/S     Discharge diagnosis: Term Pregnancy Delivered, GDM A1 and Anemia                                                                                                Post partum procedures:none  Augmentation: N/A  Complications: None  Hospital course:  Sceduled C/S   31 y.o. yo G3P0111 at 3859w2d was admitted to the hospital 06/24/2017 for scheduled cesarean section with the following indication:Elective Repeat.  Membrane Rupture Time/Date: 11:43 AM ,06/24/2017   Patient delivered a Viable infant.06/24/2017  Details of operation can be found in separate operative note.  Pateint had an uncomplicated postpartum course.  She is ambulating, tolerating a regular diet, passing flatus, and urinating well. Patient is discharged home in stable condition on  06/26/17         Physical exam  Vitals:   06/25/17 0453 06/25/17 0940 06/25/17 1743 06/26/17 0550  BP: (!) 103/57 (!) 107/56 (!) 105/51 (!) 112/48  Pulse: 84 93 (!) 110 99  Resp: 17 10 14 20   Temp: 98.4 F (36.9 C) 98.3 F (36.8 C) 97.9 F (36.6 C) 98.2 F (36.8 C)  TempSrc: Oral Oral Oral Oral  SpO2: 98%   98%  Weight:      Height:       General: alert and cooperative Lochia: appropriate Uterine Fundus: firm Incision: honeycomb intact; marked and unchanged DVT Evaluation: No evidence of DVT seen on physical exam. Labs: Lab Results  Component Value Date   WBC 11.9 (H) 06/25/2017   HGB 8.6 (L) 06/25/2017   HCT 25.4 (L) 06/25/2017   MCV 93.4 06/25/2017   PLT 190 06/25/2017   CMP Latest Ref Rng & Units  05/15/2017  Glucose 65 - 99 mg/dL 85    Discharge instruction: per After Visit Summary and "Baby and Me Booklet".  After visit meds:  Allergies as of 06/26/2017      Reactions   Penicillins Nausea And Vomiting   Has patient had a PCN reaction causing immediate rash, facial/tongue/throat swelling, SOB or lightheadedness with hypotension: No Has patient had a PCN reaction causing severe rash involving mucus membranes or skin necrosis: No Has patient had a PCN reaction that required hospitalization: No Has patient had a PCN reaction occurring within the last 10 years: No If all of the above answers are "NO", then may proceed with Cephalosporin use.      Medication List    STOP taking these medications   ACCU-CHEK FASTCLIX LANCETS Misc   glucose blood test strip     TAKE these medications   ferrous sulfate 325 (65 FE) MG tablet Take 1 tablet (325 mg total) by mouth 2 (two) times  daily with a meal.   ibuprofen 600 MG tablet Commonly known as:  ADVIL,MOTRIN Take 1 tablet (600 mg total) by mouth every 6 (six) hours as needed.   oxyCODONE 5 MG immediate release tablet Commonly known as:  Oxy IR/ROXICODONE Take 1 tablet (5 mg total) by mouth every 4 (four) hours as needed (pain scale 4-7).       Diet: routine diet  Activity: Advance as tolerated. Pelvic rest for 6 weeks.   Outpatient follow ZO:XWRUEAVWup:incision check in 1-2 wks; PP visit at 6 weeks with glucola Follow up Appt: Future Appointments  Date Time Provider Department Center  07/15/2017 10:00 AM [facility] NURSE [facility]  08/05/2017 10:30 AM Shannon Archer [facility]   Follow up Visit:No Follow-up on file.  Postpartum contraception: Tubal Ligation  Newborn Data: Live born female  Birth Weight: 9 lb 0.3 oz (4090 g) APGAR: 8, 9  Newborn Delivery   Birth date/time:  06/24/2017 11:46:00 Delivery type:  C-Section, Low Transverse C-section categorization:  Repeat     Baby Feeding:  Breast Disposition:home with mother   06/26/2017 Shannon Archer 7:20 AM

## 2017-07-02 ENCOUNTER — Encounter (HOSPITAL_COMMUNITY): Payer: Self-pay | Admitting: Family Medicine

## 2017-07-02 NOTE — Transfer of Care (Signed)
Immediate Anesthesia Transfer of Care Note  Patient: Shannon PaddockNatasha Archer  Procedure(s) Performed: CESAREAN SECTION WITH BILATERAL TUBAL LIGATION (N/A Abdomen)  Patient Location: PACU  Anesthesia Type:Epidural  Level of Consciousness: awake, alert  and oriented  Airway & Oxygen Therapy: Patient Spontanous Breathing and Patient connected to nasal cannula oxygen  Post-op Assessment: Report given to RN and Post -op Vital signs reviewed and stable  Post vital signs: Reviewed and stable  Last Vitals:  Vitals:   06/25/17 1743 06/26/17 0550  BP: (!) 105/51 (!) 112/48  Pulse: (!) 110 99  Resp: 14 20  Temp: 36.6 C 36.8 C  SpO2:  98%    Last Pain:  Vitals:   06/26/17 1137  TempSrc:   PainSc: 7       Patients Stated Pain Goal: 0 (06/24/17 1400)  Complications: No apparent anesthesia complications

## 2017-07-07 ENCOUNTER — Inpatient Hospital Stay (HOSPITAL_COMMUNITY): Payer: Medicaid Other

## 2017-07-07 ENCOUNTER — Observation Stay (HOSPITAL_COMMUNITY)
Admission: AD | Admit: 2017-07-07 | Discharge: 2017-07-09 | Disposition: A | Discharge status: Home / Self Care | Payer: Medicaid Other | Source: Ambulatory Visit | Attending: Obstetrics and Gynecology | Admitting: Obstetrics and Gynecology

## 2017-07-07 ENCOUNTER — Encounter (HOSPITAL_COMMUNITY): Payer: Self-pay | Admitting: *Deleted

## 2017-07-07 DIAGNOSIS — Z79899 Other long term (current) drug therapy: Secondary | ICD-10-CM | POA: Diagnosis not present

## 2017-07-07 DIAGNOSIS — B9689 Other specified bacterial agents as the cause of diseases classified elsewhere: Secondary | ICD-10-CM | POA: Insufficient documentation

## 2017-07-07 DIAGNOSIS — Z98891 History of uterine scar from previous surgery: Secondary | ICD-10-CM

## 2017-07-07 DIAGNOSIS — R110 Nausea: Secondary | ICD-10-CM | POA: Insufficient documentation

## 2017-07-07 DIAGNOSIS — F17210 Nicotine dependence, cigarettes, uncomplicated: Secondary | ICD-10-CM | POA: Insufficient documentation

## 2017-07-07 DIAGNOSIS — G8918 Other acute postprocedural pain: Secondary | ICD-10-CM | POA: Insufficient documentation

## 2017-07-07 DIAGNOSIS — O9989 Other specified diseases and conditions complicating pregnancy, childbirth and the puerperium: Principal | ICD-10-CM | POA: Insufficient documentation

## 2017-07-07 DIAGNOSIS — O8612 Endometritis following delivery: Secondary | ICD-10-CM | POA: Diagnosis not present

## 2017-07-07 DIAGNOSIS — O24410 Gestational diabetes mellitus in pregnancy, diet controlled: Secondary | ICD-10-CM | POA: Diagnosis not present

## 2017-07-07 DIAGNOSIS — R1012 Left upper quadrant pain: Secondary | ICD-10-CM | POA: Diagnosis not present

## 2017-07-07 DIAGNOSIS — T8140XA Infection following a procedure, unspecified, initial encounter: Secondary | ICD-10-CM

## 2017-07-07 DIAGNOSIS — R1031 Right lower quadrant pain: Secondary | ICD-10-CM | POA: Insufficient documentation

## 2017-07-07 LAB — URINALYSIS, ROUTINE W REFLEX MICROSCOPIC
BILIRUBIN URINE: NEGATIVE
Glucose, UA: NEGATIVE mg/dL
KETONES UR: NEGATIVE mg/dL
Nitrite: NEGATIVE
PH: 5 (ref 5.0–8.0)
Protein, ur: 30 mg/dL — AB
SPECIFIC GRAVITY, URINE: 1.024 (ref 1.005–1.030)

## 2017-07-07 LAB — COMPREHENSIVE METABOLIC PANEL
ALK PHOS: 85 U/L (ref 38–126)
ALT: 11 U/L — AB (ref 14–54)
AST: 12 U/L — AB (ref 15–41)
Albumin: 3.4 g/dL — ABNORMAL LOW (ref 3.5–5.0)
Anion gap: 11 (ref 5–15)
BUN: 14 mg/dL (ref 6–20)
CALCIUM: 9.3 mg/dL (ref 8.9–10.3)
CHLORIDE: 102 mmol/L (ref 101–111)
CO2: 24 mmol/L (ref 22–32)
CREATININE: 0.69 mg/dL (ref 0.44–1.00)
GFR calc Af Amer: >60 mL/min (ref >60)
GFR calc non Af Amer: >60 mL/min (ref >60)
GLUCOSE: 92 mg/dL (ref 65–99)
Potassium: 4.7 mmol/L (ref 3.5–5.1)
SODIUM: 137 mmol/L (ref 135–145)
Total Bilirubin: 0.5 mg/dL (ref 0.3–1.2)
Total Protein: 7.8 g/dL (ref 6.5–8.1)

## 2017-07-07 LAB — DIFFERENTIAL
Basophils Absolute: 0 10*3/uL (ref 0.0–0.1)
Basophils Relative: 0 %
EOS ABS: 0.1 10*3/uL (ref 0.0–0.7)
EOS PCT: 0 %
LYMPHS ABS: 2 10*3/uL (ref 0.7–4.0)
Lymphocytes Relative: 12 %
MONO ABS: 0.6 10*3/uL (ref 0.1–1.0)
MONOS PCT: 4 %
Neutro Abs: 14 10*3/uL — ABNORMAL HIGH (ref 1.7–7.7)
Neutrophils Relative %: 84 %

## 2017-07-07 LAB — CBC
HCT: 35.1 % — ABNORMAL LOW (ref 36.0–46.0)
HEMOGLOBIN: 11.4 g/dL — AB (ref 12.0–15.0)
MCH: 30.5 pg (ref 26.0–34.0)
MCHC: 32.5 g/dL (ref 30.0–36.0)
MCV: 93.9 fL (ref 78.0–100.0)
PLATELETS: 579 10*3/uL — AB (ref 150–400)
RBC: 3.74 MIL/uL — AB (ref 3.87–5.11)
RDW: 13.6 % (ref 11.5–15.5)
WBC: 16.7 10*3/uL — AB (ref 4.0–10.5)

## 2017-07-07 MED ORDER — SIMETHICONE 80 MG PO CHEW
80.0000 mg | CHEWABLE_TABLET | Freq: Four times a day (QID) | ORAL | Status: DC | PRN
Start: 2017-07-07 — End: 2017-07-09
  Administered 2017-07-07 – 2017-07-08 (×3): 80 mg via ORAL
  Filled 2017-07-07 (×3): qty 1

## 2017-07-07 MED ORDER — IOPAMIDOL (ISOVUE-300) INJECTION 61%
100.0000 mL | Freq: Once | INTRAVENOUS | Status: AC | PRN
  Administered 2017-07-07: 100 mL via INTRAVENOUS

## 2017-07-07 MED ORDER — SODIUM CHLORIDE 0.9% FLUSH
3.0000 mL | INTRAVENOUS | Status: DC | PRN
  Administered 2017-07-08: 3 mL via INTRAVENOUS
  Filled 2017-07-07: qty 3

## 2017-07-07 MED ORDER — MAGNESIUM HYDROXIDE 400 MG/5ML PO SUSP: 30.0000 mL | Freq: Every day | ORAL | Status: DC | PRN

## 2017-07-07 MED ORDER — CLINDAMYCIN PHOSPHATE 900 MG/50ML IV SOLN: 900.0000 mg | Freq: Three times a day (TID) | INTRAVENOUS | Status: DC

## 2017-07-07 MED ORDER — PRENATAL MULTIVITAMIN CH
1.0000 | ORAL_TABLET | Freq: Every day | ORAL | Status: DC
  Administered 2017-07-08 – 2017-07-09 (×2): 1 via ORAL
  Filled 2017-07-07 (×2): qty 1

## 2017-07-07 MED ORDER — IOPAMIDOL (ISOVUE-300) INJECTION 61%
30.0000 mL | Freq: Once | INTRAVENOUS | Status: AC | PRN
  Administered 2017-07-07: 30 mL via ORAL

## 2017-07-07 MED ORDER — ZOLPIDEM TARTRATE 5 MG PO TABS: 5.0000 mg | ORAL_TABLET | Freq: Every evening | ORAL | Status: DC | PRN

## 2017-07-07 MED ORDER — ALUM & MAG HYDROXIDE-SIMETH 200-200-20 MG/5ML PO SUSP: 30.0000 mL | ORAL | Status: DC | PRN

## 2017-07-07 MED ORDER — OXYCODONE-ACETAMINOPHEN 5-325 MG PO TABS: 1.0000 | ORAL_TABLET | ORAL | Status: DC | PRN

## 2017-07-07 MED ORDER — SODIUM CHLORIDE 0.9 % IV SOLN: 250.0000 mL | INTRAVENOUS | Status: DC | PRN

## 2017-07-07 MED ORDER — SODIUM CHLORIDE 0.9% FLUSH
3.0000 mL | Freq: Two times a day (BID) | INTRAVENOUS | Status: DC
  Administered 2017-07-07 – 2017-07-09 (×4): 3 mL via INTRAVENOUS

## 2017-07-07 MED ORDER — DOCUSATE SODIUM 100 MG PO CAPS
100.0000 mg | ORAL_CAPSULE | Freq: Two times a day (BID) | ORAL | Status: DC
  Administered 2017-07-07 – 2017-07-09 (×4): 100 mg via ORAL
  Filled 2017-07-07 (×4): qty 1

## 2017-07-07 MED ORDER — IBUPROFEN 800 MG PO TABS
800.0000 mg | ORAL_TABLET | Freq: Three times a day (TID) | ORAL | Status: DC | PRN
  Administered 2017-07-07: 800 mg via ORAL
  Filled 2017-07-07: qty 1

## 2017-07-07 MED ORDER — GENTAMICIN SULFATE 40 MG/ML IJ SOLN
Freq: Three times a day (TID) | INTRAVENOUS | Status: DC
  Administered 2017-07-07 – 2017-07-09 (×6): via INTRAVENOUS
  Filled 2017-07-07 (×6): qty 3.25

## 2017-07-07 NOTE — Progress Notes (Signed)
Pharmacy Antibiotic Note  Carmelina Paddockatasha Moilanen is a 31 y.o. female admitted on 07/07/2017 with postpartum endometritis.  Pharmacy has been consulted for Gentamicin dosing. Pt is s/p Day #13 C-section with BTL. Presents with RLQ pain, chills and nausea  Plan: 1. Gentamicin 130mg  IV q8h. 2. Will continue to follow and assess need for Gentamicin levels at steady state depending on pt's clinical status.  Height: 5\' 2"  (157.5 cm) Weight: 123 lb (55.8 kg) IBW/kg (Calculated) : 50.1  Adjusted/Dosing weight: 55.8kg  Temp (24hrs), Avg:98.4 F (36.9 C), Min:98.2 F (36.8 C), Max:98.7 F (37.1 C)  Recent Labs  Lab 07/07/17 1358  WBC 16.7*  CREATININE 0.69    Estimated Creatinine Clearance: 79.8 mL/min (by C-G formula based on SCr of 0.69 mg/dL).    Allergies  Allergen Reactions  . Penicillins Nausea And Vomiting    Has patient had a PCN reaction causing immediate rash, facial/tongue/throat swelling, SOB or lightheadedness with hypotension: No Has patient had a PCN reaction causing severe rash involving mucus membranes or skin necrosis: No Has patient had a PCN reaction that required hospitalization: No Has patient had a PCN reaction occurring within the last 10 years: No If all of the above answers are "NO", then may proceed with Cephalosporin use.     Antimicrobials this admission: Clindamycin 900mg  IV q8h  Dose adjustments this admission:   Microbiology results:  Thank you for allowing pharmacy to be a part of this patient's care.  Claybon Jabsngel, Andriel Omalley G 07/07/2017 6:55 PM

## 2017-07-07 NOTE — MAU Provider Note (Signed)
History     CSN: [id number]  Arrival date and time: 07/07/17 1217   First Provider Initiated Contact with Patient 07/07/17 1318      Chief Complaint  Patient presents with  . Abdominal Pain   HPI Shannon Paddockatasha Azzaro is a 31 y.o. [id number] female who presents with abdominal pain. Patient is 2 weeks s/p RCS & BTL. Symptoms began yesterday. Reports intermittent sharp pains in RLQ. Rates pain 10/10. Pain worse with movement. Has been taking ibuprofen with no relief. Denies fever/chills, vomiting, diarrhea, dysuria, or vaginal bleeding. Last BM was 2 days ago & reports normal for her. Some nausea associated with pain.   OB History    Gravida Para Term Preterm AB Living   3 2 1 1 1 2    SAB TAB Ectopic Multiple Live Births   1 0 0 0 1      Obstetric Comments   36wks spontaneous PTB (PPROM). Previous C/S - Breech Presentation.       Past Medical History:  Diagnosis Date  . Diabetes mellitus without complication (HCC)   . Gestational diabetes   . History of preterm labor     Past Surgical History:  Procedure Laterality Date  . CESAREAN SECTION  2011   Breech  . CESAREAN SECTION WITH BILATERAL TUBAL LIGATION N/A 06/24/2017   Procedure: CESAREAN SECTION WITH BILATERAL TUBAL LIGATION;  Surgeon: Levie HeritageStinson, Jacob J, DO;  Location: [facility];  Service: Obstetrics;  Laterality: N/A;  . DILATION AND EVACUATION N/A 08/11/2014   Procedure: DILATATION AND EVACUATION;  Surgeon: Juluis MireJohn S McComb, MD;  Location: [facility];  Service: Gynecology;  Laterality: N/A;  . NOSE SURGERY      Family History  Problem Relation Age of Onset  . Heart disease Maternal Grandmother   . Cancer Maternal Grandmother   . Diabetes Maternal Grandmother   . Diabetes Maternal Grandfather   . Cancer Paternal Grandmother     Social History   Tobacco Use  . Smoking status: Current Every Day Smoker    Packs/day: 0.50    Types: Cigarettes  . Smokeless tobacco: Never Used  Substance Use Topics  . Alcohol use: No   . Drug use: No    Allergies:  Allergies  Allergen Reactions  . Penicillins Nausea And Vomiting    Has patient had a PCN reaction causing immediate rash, facial/tongue/throat swelling, SOB or lightheadedness with hypotension: No Has patient had a PCN reaction causing severe rash involving mucus membranes or skin necrosis: No Has patient had a PCN reaction that required hospitalization: No Has patient had a PCN reaction occurring within the last 10 years: No If all of the above answers are "NO", then may proceed with Cephalosporin use.     Medications Prior to Admission  Medication Sig Dispense Refill Last Dose  . ferrous sulfate 325 (65 FE) MG tablet Take 1 tablet (325 mg total) by mouth 2 (two) times daily with a meal. 60 tablet 3   . ibuprofen (ADVIL,MOTRIN) 600 MG tablet Take 1 tablet (600 mg total) by mouth every 6 (six) hours as needed. 30 tablet 0   . oxyCODONE (OXY IR/ROXICODONE) 5 MG immediate release tablet Take 1 tablet (5 mg total) by mouth every 4 (four) hours as needed (pain scale 4-7). 50 tablet 0     Review of Systems  Constitutional: Negative.   Gastrointestinal: Positive for abdominal pain and nausea. Negative for constipation, diarrhea and vomiting.  Genitourinary: Negative.    Physical Exam   Blood pressure 126/61,  pulse (!) 109, temperature 98.4 F (36.9 C), temperature source Oral, resp. rate 18, height 5\' 2"  (1.575 m), weight 123 lb (55.8 kg), last menstrual period 09/24/2016, unknown if currently breastfeeding.  Physical Exam  Nursing note and vitals reviewed. Constitutional: She is oriented to person, place, and time. She appears well-developed and well-nourished. No distress.  HENT:  Head: Normocephalic and atraumatic.  Eyes: Conjunctivae are normal. Right eye exhibits no discharge. Left eye exhibits no discharge. No scleral icterus.  Neck: Normal range of motion.  Cardiovascular: Normal rate, regular rhythm and normal heart sounds.  No murmur  heard. Respiratory: Effort normal and breath sounds normal. No respiratory distress. She has no wheezes.  GI: Soft. Bowel sounds are normal. There is tenderness in the right lower quadrant and left upper quadrant. There is no rigidity, no rebound and no guarding.  Neurological: She is alert and oriented to person, place, and time.  Skin: Skin is warm and dry. She is not diaphoretic.  Psychiatric: She has a normal mood and affect. Her behavior is normal. Judgment and thought content normal.    MAU Course  Procedures Results for orders placed or performed during the hospital encounter of 07/07/17 (from the past 24 hour(s))  Urinalysis, Routine w reflex microscopic     Status: Abnormal   Collection Time: 07/07/17 12:40 PM  Result Value Ref Range   Color, Urine YELLOW YELLOW   APPearance HAZY (A) CLEAR   Specific Gravity, Urine 1.024 1.005 - 1.030   pH 5.0 5.0 - 8.0   Glucose, UA NEGATIVE NEGATIVE mg/dL   Hgb urine dipstick MODERATE (A) NEGATIVE   Bilirubin Urine NEGATIVE NEGATIVE   Ketones, ur NEGATIVE NEGATIVE mg/dL   Protein, ur 30 (A) NEGATIVE mg/dL   Nitrite NEGATIVE NEGATIVE   Leukocytes, UA LARGE (A) NEGATIVE   RBC / HPF 0-5 0 - 5 RBC/hpf   WBC, UA 6-30 0 - 5 WBC/hpf   Bacteria, UA RARE (A) NONE SEEN   Squamous Epithelial / LPF 0-5 (A) NONE SEEN   Mucus PRESENT   CBC     Status: Abnormal   Collection Time: 07/07/17  1:58 PM  Result Value Ref Range   WBC 16.7 (H) 4.0 - 10.5 K/uL   RBC 3.74 (L) 3.87 - 5.11 MIL/uL   Hemoglobin 11.4 (L) 12.0 - 15.0 g/dL   HCT 16.135.1 (L) 09.636.0 - 04.546.0 %   MCV 93.9 78.0 - 100.0 fL   MCH 30.5 26.0 - 34.0 pg   MCHC 32.5 30.0 - 36.0 g/dL   RDW 40.913.6 81.111.5 - 91.415.5 %   Platelets 579 (H) 150 - 400 K/uL  Comprehensive metabolic panel     Status: Abnormal   Collection Time: 07/07/17  1:58 PM  Result Value Ref Range   Sodium 137 135 - 145 mmol/L   Potassium 4.7 3.5 - 5.1 mmol/L   Chloride 102 101 - 111 mmol/L   CO2 24 22 - 32 mmol/L   Glucose, Bld 92 65  - 99 mg/dL   BUN 14 6 - 20 mg/dL   Creatinine, Ser 7.820.69 0.44 - 1.00 mg/dL   Calcium 9.3 8.9 - 95.610.3 mg/dL   Total Protein 7.8 6.5 - 8.1 g/dL   Albumin 3.4 (L) 3.5 - 5.0 g/dL   AST 12 (L) 15 - 41 U/L   ALT 11 (L) 14 - 54 U/L   Alkaline Phosphatase 85 38 - 126 U/L   Total Bilirubin 0.5 0.3 - 1.2 mg/dL   GFR calc non Af  Amer >60 >60 mL/min   GFR calc Af Amer >60 >60 mL/min   Anion gap 11 5 - 15  Differential     Status: Abnormal   Collection Time: 07/07/17  1:58 PM  Result Value Ref Range   Neutrophils Relative % 84 %   Neutro Abs 14.0 (H) 1.7 - 7.7 K/uL   Lymphocytes Relative 12 %   Lymphs Abs 2.0 0.7 - 4.0 K/uL   Monocytes Relative 4 %   Monocytes Absolute 0.6 0.1 - 1.0 K/uL   Eosinophils Relative 0 %   Eosinophils Absolute 0.1 0.0 - 0.7 K/uL   Basophils Relative 0 %   Basophils Absolute 0.0 0.0 - 0.1 K/uL   US Pelvis (transabdominal Only)  Result Date: 07/07/2017 CLINICAL DATA:  Right-sided pelvic pain for 1 day. Patient is about 2 weeks postpartum from Cesarean section. EXAM: TRANSABDOMINAL ULTRASOUND OF PELVIS TECHNIQUE: Transabdominal ultrasound examination of the pelvis was performed including evaluation of the uterus, ovaries, adnexal regions, and pelvic cul-de-sac. COMPARISON:  None. FINDINGS: Study is limited by bandage material overlying the low transverse incision. Uterus Measurements: 14.1 x 6.9 x 9.5 cm. Complex structures identified in the region of the lower uterine segment but the relationship to the uterus is not clearly delineated on this study. This may be in the wall of the anterior lower uterine segment. Lesion measures about 6 cm in shows internal reticular echoes consistent with complex lesion. Endometrium Not visualized. Right ovary Measurements: Nonvisualized. Sonographer initially interpreted vascular anatomy is the ovary but was not confident this represented ovarian parenchyma. Left ovary Measurements: 2.8 x 2.6 x 1.8 cm. Normal appearance/no adnexal mass. Other  findings: Free fluid is identified in the right adnexal region. IMPRESSION: 1. Apparent complex cystic lesion in the region of the lower uterine segment, potentially in the wall of the lower uterus. This does appear to be distinct from the bladder dome and could be a hematoma although superinfected fluid collection/abscess is not excluded. Study limited by poor acoustic window due to bandage material overlying the low transverse incision. If there is clinical concern for hematoma or abscess, CT imaging of the abdomen and pelvis with oral and intravenous contrast would be the study of choice to further evaluate. Electronically Signed   By: Kennith Center M.D.   On: 07/07/2017 14:47   Ct Abdomen Pelvis W Contrast  Result Date: 07/07/2017 CLINICAL DATA:  Post Caesarean section on 06/24/2017, lower pelvic pain for several days, sharp RIGHT lower quadrant pain today, nausea, suspected postoperative complication, history of diabetes mellitus EXAM: CT ABDOMEN AND PELVIS WITH CONTRAST TECHNIQUE: Multidetector CT imaging of the abdomen and pelvis was performed using the standard protocol following bolus administration of intravenous contrast. Sagittal and coronal MPR images reconstructed from axial data set. CONTRAST:  ISOVUE-300 IOPAMIDOL (ISOVUE-300) INJECTION 61% IV. Patient drank dilute oral contrast for exam. COMPARISON:  None FINDINGS: Lower chest: Lung bases clear Hepatobiliary: Gallbladder and liver normal appearance Pancreas: Normal appearance Spleen: Normal appearance.  Small splenule. Adrenals/Urinary Tract: Adrenal glands normal appearance. Kidneys and bladder normal appearance. Ureters appear mildly prominent in caliber proximally without definite ureteral calculus. Stomach/Bowel: Stomach normal appearance. Small bowel loops normal appearance. Cecum is not completely distended, unable to exclude mild wall thickening. Normal appendix. Remainder of colon normal appearance. Vascular/Lymphatic: Vascular  structures unremarkable. No adenopathy. Reproductive: Unremarkable ovaries. Enlarged postpartum uterus. Fluid within the endometrial canal extending into endocervical canal. Fluid collection in the anterior uterine wall at the site of the casearian section measuring 2.8  x 2.8 x approximately 5.5 cm diameter. Additional area of inhomogeneous decreased enhancement is seen at the posterior aspect of the uterine fundus. No areas of abnormal increased enhancement are seen within the endometrial canal. Findings are most suspicious for combination of postoperative changes with potentially infection endometritis/myometritis. No definite discretely visualize retained products of conception are seen though not excluded. Other: Small amount of free fluid in pelvis. No free air. No hernia. Infiltrative changes of the anterior pelvic wall from preceding surgery without enhancing postoperative collection. 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 IMPRESSION: Significant fluid within the endometrial and endocervical canals with a focal fluid collection at the anterior uterine wall 2.8 x 2.8 x 5.5 cm associated with inhomogeneous decreased enhancement of the uterine wall to fundus, findings overall most suspicious for infection endometritis/myometritis. No definite enhancement is seen to suggest retained products of conception though not completely excluded. Free fluid in pelvis without focal abscess collection. Electronically Signed   By: Ulyses Southward M.D.   On: 07/07/2017 16:59    MDM VSS, afebrile CBC -- elevated WBC, differential pending Pelvic ultrasound -- hematoma vs abscess, recommends CT -- CT ordered C/w Dr. Earlene Plater. Reviewed assessment, labs, & CT results. Will admit patient for IV abx.   Assessment and Plan  A: 1. Post-operative infection   2. Status post repeat low transverse cesarean section   3. Post-op pain    P: Admit for IV abx per Dr. Julieta Bellini 07/07/2017, 1:18 PM

## 2017-07-07 NOTE — H&P (Addendum)
Post Partum History and Physical  Shannon Archer is a 31 y.o. [id number] POD#13 s/p RCS + BTL presenting for significant RLQ pain starting yestserday, worsening today. She has had issues with gas and thought it was gas pain, however when pain became much worse, she decided to come in. Reports shooting waves of pain in lower abdomen, rating 10/10 when they are bad and improving mildly from that. Started yesterday and has worsened today. Also with nausea and chills today as well as a "heat wave" passing through her. No fever but has been taking oxycodone and ibuprofen. Up until yesterday, she was recovering well, tolerating regular diet, voiding regularly. Last BM was two days and and she has had some difficulty passing gas but overall has been doing well. Baby is also doing well and she has been breastfeeding.    Patient Active Problem List   Diagnosis Date Noted  . Endometritis following delivery 07/07/2017  . Status post repeat low transverse cesarean section 06/24/2017  . Status post tubal ligation 06/24/2017  . GDM (gestational diabetes mellitus), class A1 04/06/2017  . 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 Supervision of high risk pregnancy, antepartum, second trimester 01/14/2017  . History of cesarean delivery 01/14/2017  . History of preterm delivery 01/14/2017  . Tobacco abuse 01/14/2017   Past Medical History:  Diagnosis Date  . Diabetes mellitus without complication (HCC)   . Gestational diabetes   . History of preterm labor     Past Surgical History:  Procedure Laterality Date  . CESAREAN SECTION  2011   Breech  . CESAREAN SECTION WITH BILATERAL TUBAL LIGATION N/A 06/24/2017   Procedure: CESAREAN SECTION WITH BILATERAL TUBAL LIGATION;  Surgeon: Levie Heritage, DO;  Location: [facility];  Service: Obstetrics;  Laterality: N/A;  . DILATION AND EVACUATION N/A 08/11/2014   Procedure: DILATATION AND EVACUATION;  Surgeon: Juluis Mire,  MD;  Location: [facility];  Service: Gynecology;  Laterality: N/A;  . NOSE SURGERY      OB History  Gravida Para Term Preterm AB Living  3 2 1 1 1 2   SAB TAB Ectopic Multiple Live Births  1 0 0 0 1    # Outcome Date GA Lbr Len/2nd Weight Sex Delivery Anes PTL Lv  3 Term 06/25/17 [redacted]w[redacted]d    CS-LTranv     2 SAB 08/12/14 [redacted]w[redacted]d    SAB     1 Preterm 07/16/09 [redacted]w[redacted]d  5 lb 10 oz (2.551 kg) F CS-LTranv   LIV    Obstetric Comments  36wks spontaneous PTB (PPROM). Previous C/S - Breech Presentation.     Social History   Socioeconomic History  . Marital status: Divorced    Spouse name: None  . Number of children: None  . Years of education: None  . Highest education level: None  Social Needs  . Financial resource strain: None  . Food insecurity - worry: None  . Food insecurity - inability: None  . Transportation needs - medical: None  . Transportation needs - non-medical: None  Occupational History  . None  Tobacco Use  . Smoking status: Current Every Day Smoker    Packs/day: 0.50    Types: Cigarettes  . Smokeless tobacco: Never Used  Substance and Sexual Activity  . Alcohol use: No  . Drug use: No  . Sexual activity: Yes    Birth control/protection: None  Other Topics Concern  . None  Social History Narrative  . None  Family History  Problem Relation Age of Onset  . Heart disease Maternal Grandmother   . Cancer Maternal Grandmother   . Diabetes Maternal Grandmother   . Diabetes Maternal Grandfather   . Cancer Paternal Grandmother     Medications Prior to Admission  Medication Sig Dispense Refill Last Dose  . ibuprofen (ADVIL,MOTRIN) 600 MG tablet Take 1 tablet (600 mg total) by mouth every 6 (six) hours as needed. 30 tablet 0 07/07/2017 at Unknown time  . oxyCODONE (OXY IR/ROXICODONE) 5 MG immediate release tablet Take 1 tablet (5 mg total) by mouth every 4 (four) hours as needed (pain scale 4-7). 50 tablet 0 07/06/2017 at Unknown time  . ferrous sulfate 325 (65 FE) MG  tablet Take 1 tablet (325 mg total) by mouth 2 (two) times daily with a meal. (Patient not taking: Reported on 07/07/2017) 60 tablet 3 Not Taking at Unknown time    Allergies  Allergen Reactions  . Penicillins Nausea And Vomiting    Has patient had a PCN reaction causing immediate rash, facial/tongue/throat swelling, SOB or lightheadedness with hypotension: No Has patient had a PCN reaction causing severe rash involving mucus membranes or skin necrosis: No Has patient had a PCN reaction that required hospitalization: No Has patient had a PCN reaction occurring within the last 10 years: No If all of the above answers are "NO", then may proceed with Cephalosporin use.     Review of Systems: Negative except for what is mentioned in HPI.  Physical Exam: BP 126/61 (BP Location: Right Arm)   Pulse (!) 109   Temp 98.4 F (36.9 C) (Oral)   Resp 18   Ht 5\' 2"  (1.575 m)   Wt 123 lb (55.8 kg)   LMP 09/24/2016   Breastfeeding? Unknown   BMI 22.50 kg/m  CONSTITUTIONAL: Well-developed, well-nourished female in mild acute distress.  HENT:  Normocephalic, atraumatic, External right and left ear normal. Oropharynx is clear and moist EYES: Conjunctivae and EOM are normal. Pupils are equal, round, and reactive to light. No scleral icterus.  NECK: Normal range of motion, supple, no masses SKIN: Skin is warm and dry. No rash noted. Not diaphoretic. No erythema. No pallor. NEUROLOGIC: Alert and oriented to person, place, and time. Normal reflexes, muscle tone coordination. No cranial nerve deficit noted. PSYCHIATRIC: Normal mood and affect. Normal behavior. Normal judgment and thought content. CARDIOVASCULAR: Normal heart rate noted, regular rhythm RESPIRATORY: Effort and breath sounds normal, no problems with respiration noted ABDOMEN: Soft, mildly tender LLQ, moderately tender RLQ, fundus barely palpable below umbilicus, well healed pfannenstiel incision, removed steri-strips, no oozing or evidence  of superficial infection USCULOSKELETAL: Normal range of motion. No edema and no tenderness. 2+ distal pulses.   Pertinent Labs/Studies:   CBC Latest Ref Rng & Units 07/07/2017 06/25/2017 06/23/2017  WBC 4.0 - 10.5 K/uL 16.7(H) 11.9(H) 9.5  Hemoglobin 12.0 - 15.0 g/dL 11.4(L) 8.6(L) 11.9(L)  Hematocrit 36.0 - 46.0 % 35.1(L) 25.4(L) 35.3(L)  Platelets 150 - 400 K/uL 579(H) 190 229   CMP Latest Ref Rng & Units 07/07/2017 05/15/2017  Glucose 65 - 99 mg/dL 92 85  BUN 6 - 20 mg/dL 14 -  Creatinine 4.090.44 - 1.00 mg/dL 8.110.69 -  Sodium 914135 - 782145 mmol/L 137 -  Potassium 3.5 - 5.1 mmol/L 4.7 -  Chloride 101 - 111 mmol/L 102 -  CO2 22 - 32 mmol/L 24 -  Calcium 8.9 - 10.3 mg/dL 9.3 -  Total Protein 6.5 - 8.1 g/dL 7.8 -  Total Bilirubin  0.3 - 1.2 mg/dL 0.5 -  Alkaline Phos 38 - 126 U/L 85 -  AST 15 - 41 U/L 12(L) -  ALT 14 - 54 U/L 11(L) -    Study Result   CLINICAL DATA:  Post Caesarean section on 06/24/2017, lower pelvic pain for several days, sharp RIGHT lower quadrant pain today, nausea, suspected postoperative complication, history of diabetes mellitus  EXAM: CT ABDOMEN AND PELVIS WITH CONTRAST  TECHNIQUE: Multidetector CT imaging of the abdomen and pelvis was performed using the standard protocol following bolus administration of intravenous contrast. Sagittal and coronal MPR images reconstructed from axial data set.  CONTRAST:  100mL ISOVUE-300 IOPAMIDOL (ISOVUE-300) INJECTION 61% IV. Patient drank dilute oral contrast for exam.  COMPARISON:  None  FINDINGS: Lower chest: Lung bases clear  Hepatobiliary: Gallbladder and liver normal appearance  Pancreas: Normal appearance  Spleen: Normal appearance.  Small splenule.  Adrenals/Urinary Tract: Adrenal glands normal appearance. Kidneys and bladder normal appearance. Ureters appear mildly prominent in caliber proximally without definite ureteral calculus.  Stomach/Bowel: Stomach normal appearance. Small bowel  loops normal appearance. Cecum is not completely distended, unable to exclude mild wall thickening. Normal appendix. Remainder of colon normal appearance.  Vascular/Lymphatic: Vascular structures unremarkable. No adenopathy.  Reproductive: Unremarkable ovaries. Enlarged postpartum uterus. Fluid within the endometrial canal extending into endocervical canal. Fluid collection in the anterior uterine wall at the site of the casearian section measuring 2.8 x 2.8 x approximately 5.5 cm diameter. Additional area of inhomogeneous decreased enhancement is seen at the posterior aspect of the uterine fundus. No areas of abnormal increased enhancement are seen within the endometrial canal. Findings are most suspicious for combination of postoperative changes with potentially infection endometritis/myometritis. No definite discretely visualize retained products of conception are seen though not excluded.  Other: Small amount of free fluid in pelvis. No free air. No hernia. Infiltrative changes of the anterior pelvic wall from preceding surgery without enhancing postoperative collection.  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  IMPRESSION: Significant fluid within the endometrial and endocervical canals with a focal fluid collection at the anterior uterine wall 2.8 x 2.8 x 5.5 cm associated with inhomogeneous decreased enhancement of the uterine wall to fundus, findings overall most suspicious for infection endometritis/myometritis.  No definite enhancement is seen to suggest retained products of conception though not completely excluded.  Free fluid in pelvis without focal abscess collection.   Electronically Signed   By: Ulyses SouthwardMark  Boles M.D.   On: 07/07/2017 16:59    Assessment : Carmelina Paddockatasha People is a 31 y.o. [id number] POD#13 s/p RCS+ BTL presenting with nausea, chills, elevated WBCs, fluid collection in anterior uterine wall and significant  uterine tenderness. Will admit for presumed post operative endometritis and start antibiotics.  [facility]  Clindamycin 900 mg Q8 Pain meds prn Breast pump to bedside Activity as tolerated    K. Therese SarahMeryl Davis, M.D. Attending Obstetrician & Gynecologist, [facility]  07/07/2017, 6:34 PM

## 2017-07-07 NOTE — MAU Note (Signed)
Pt reports she delivered 2 weeks ago. Started having sharp pains in her LRQ yesterday. Had a very "big" pain about 2 hours ago that scared her.  Still having some flashes of pain on and off since but not as strong as th one. Still having vaginal bleeding that is not any differnet since the pain started.

## 2017-07-08 NOTE — Progress Notes (Signed)
Faculty Attending Note  Post Op Day 14  Subjective: Patient is feeling better. States abdominal pain is much improved this am, was able to sleep on her side.  She is ambulating and denies light-headedness or dizziness. She is passing flatus. She is tolerating a regular diet without nausea/vomiting. Bleeding is moderate. She is breast pumping.   Objective: Blood pressure (!) 105/57, pulse 74, temperature 98.6 F (37 C), temperature source Oral, resp. rate 16, height 5\' 2"  (1.575 m), weight 123 lb (55.8 kg), last menstrual period 09/24/2016, SpO2 97 %, unknown if currently breastfeeding. Temp:  [98.2 F (36.8 C)-98.9 F (37.2 C)] 98.6 F (37 C) (01/01 0339) Pulse Rate:  [74-109] 74 (01/01 0339) Resp:  [16-18] 16 (01/01 0339) BP: (105-134)/(57-79) 105/57 (01/01 0339) SpO2:  [96 %-100 %] 97 % (01/01 0339) Weight:  [123 lb (55.8 kg)] 123 lb (55.8 kg) (12/31 1239)  Physical Exam:  General: alert, oriented, cooperative Chest: CTAB, normal respiratory effort Heart: RRR  Abdomen: +BS, soft, tender to palpation but improved from last exam, incision well approximated and clean/dry/intact  Uterine Fundus: firm Lochia: moderate, rubra DVT Evaluation: no evidence of DVT Extremities: no edema, no calf tenderness   Current Facility-Administered Medications:  .  0.9 %  sodium chloride infusion, 250 mL, Intravenous, PRN, Conan Bowensavis, Maleek Craver M, MD .  alum & mag hydroxide-simeth (MAALOX/MYLANTA) 200-200-20 MG/5ML suspension 30 mL, 30 mL, Oral, Q4H PRN, Conan Bowensavis, Rosco Harriott M, MD .  docusate sodium (COLACE) capsule 100 mg, 100 mg, Oral, BID, Conan Bowensavis, Layman Gully M, MD, 100 mg at 07/07/17 2205 .  gentamicin (GARAMYCIN) 130 mg, clindamycin (CLEOCIN) 900 mg in dextrose 5 % 100 mL IVPB, , Intravenous, Q8H, Conan Bowensavis, Monna Crean M, MD, Last Rate: 218.5 mL/hr at 07/08/17 0330 .  ibuprofen (ADVIL,MOTRIN) tablet 800 mg, 800 mg, Oral, Q8H PRN, Conan Bowensavis, Clearence Vitug M, MD, 800 mg at 07/07/17 2211 .  magnesium hydroxide (MILK OF MAGNESIA) suspension  30 mL, 30 mL, Oral, Daily PRN, Conan Bowensavis, Laina Guerrieri M, MD .  oxyCODONE-acetaminophen (PERCOCET/ROXICET) 5-325 MG per tablet 1-2 tablet, 1-2 tablet, Oral, Q3H PRN, Conan Bowensavis, Aneesh Faller M, MD .  prenatal multivitamin tablet 1 tablet, 1 tablet, Oral, Q1200, Conan Bowensavis, Breniyah Romm M, MD .  simethicone [facility]MYLICON) chewable tablet 80 mg, 80 mg, Oral, QID PRN, Conan Bowensavis, Harneet Noblett M, MD, 80 mg at 07/07/17 2211 .  sodium chloride flush (NS) 0.9 % injection 3 mL, 3 mL, Intravenous, Q12H, Conan Bowensavis, Jamine Highfill M, MD, 3 mL at 07/07/17 1942 .  sodium chloride flush (NS) 0.9 % injection 3 mL, 3 mL, Intravenous, PRN, Conan Bowensavis, Milt Coye M, MD .  zolpidem [facility](AMBIEN) tablet 5 mg, 5 mg, Oral, QHS PRN, Conan Bowensavis, Liviana Mills M, MD Recent Labs    07/07/17 1358  HGB 11.4*  HCT 35.1*    Assessment/Plan:  Patient is 32 y.o. [id number] POD#14 s/p RCS+BTL, HD#2 admitted for presumed post operative endometritis. She is doing better this am, reports pain much improved although she is still having some pain. Will cont IV antibiotics x 24 hrs  cont gent/clinda Continue routine post partum care Pain meds prn Regular diet S/p BTL for birth control    Conan BowensKelly M Sabree Nuon 07/08/2017, 7:40 AM

## 2017-07-09 ENCOUNTER — Other Ambulatory Visit: Payer: Self-pay

## 2017-07-09 DIAGNOSIS — O8612 Endometritis following delivery: Secondary | ICD-10-CM | POA: Diagnosis not present

## 2017-07-09 NOTE — Discharge Summary (Signed)
Physician Discharge Summary  Patient ID: Shannon Archer MRN: [id number] DOB/AGE: 09-10-1985 32 y.o.  Admit date: 07/07/2017 Discharge date: 07/09/2017  Admission Diagnoses:Postpartum endometritis  Discharge Diagnoses:  Active Problems:   Status post repeat low transverse cesarean section   Endometritis following delivery   Discharged Condition: good  Hospital Course:  Alfredia Desanctis is a 32 y.o. [id number] POD#13 s/p RCS + BTL presenting for significant RLQ pain starting yestserday, worsening today. She has had issues with gas and thought it was gas pain, however when pain became much worse, she decided to come in. Reports shooting waves of pain in lower abdomen, rating 10/10 when they are bad and improving mildly from that. Started yesterday and has worsened today. Also with nausea and chills today as well as a "heat wave" passing through her. No fever but has been taking oxycodone and ibuprofen. Up until yesterday, she was recovering well, tolerating regular diet, voiding regularly. Last BM was two days and and she has had some difficulty passing gas but overall has been doing well. Baby is also doing well and she has been breastfeeding.   Assessment : Shannon Archer is a 32 y.o. [id number] POD#13 s/p RCS+ BTL presenting with nausea, chills, elevated WBCs, fluid collection in anterior uterine wall and significant uterine tenderness. Will admit for presumed post operative endometritis and start antibiotics.  Plan: Admit to [facility] Gentamycin per pharmacy  Clindamycin 900 mg Q8 Pain meds prn Breast pump to bedside Activity as tolerated  Patient was afebrile and no significant fundal tenderness, feeling much better on hospital day 3, ready for disharge  Consults: None  Significant Diagnostic Studies: labs:  CBC    Component Value Date/Time   WBC 16.7 (H) 07/07/2017 1358   RBC 3.74 (L) 07/07/2017 1358   HGB 11.4 (L) 07/07/2017 1358   HGB 11.9 04/04/2017 0853   HCT 35.1 (L) 07/07/2017  1358   HCT 35.5 04/04/2017 0853   PLT 579 (H) 07/07/2017 1358   PLT 255 04/04/2017 0853   MCV 93.9 07/07/2017 1358   MCV 91 04/04/2017 0853   MCH 30.5 07/07/2017 1358   MCHC 32.5 07/07/2017 1358   RDW 13.6 07/07/2017 1358   RDW 14.0 04/04/2017 0853   LYMPHSABS 2.0 07/07/2017 1358   LYMPHSABS 2.0 01/14/2017 1410   MONOABS 0.6 07/07/2017 1358   EOSABS 0.1 07/07/2017 1358   EOSABS 0.1 01/14/2017 1410   BASOSABS 0.0 07/07/2017 1358   BASOSABS 0.0 01/14/2017 1410   Study Result   CLINICAL DATA: Post Caesarean section on 06/24/2017, lower pelvic pain for several days, sharp RIGHT lower quadrant pain today, nausea, suspected postoperative complication, history of diabetes mellitus  EXAM: CT ABDOMEN AND PELVIS WITH CONTRAST  TECHNIQUE: Multidetector CT imaging of the abdomen and pelvis was performed using the standard protocol following bolus administration of intravenous contrast. Sagittal and coronal MPR images reconstructed from axial data set.  CONTRAST: ISOVUE-300 IOPAMIDOL (ISOVUE-300) INJECTION 61% IV. Patient drank dilute oral contrast for exam.  COMPARISON: None  FINDINGS: Lower chest: Lung bases clear  Hepatobiliary: Gallbladder and liver normal appearance  Pancreas: Normal appearance  Spleen: Normal appearance. Small splenule.  Adrenals/Urinary Tract: Adrenal glands normal appearance. Kidneys and bladder normal appearance. Ureters appear mildly prominent in caliber proximally without definite ureteral calculus.  Stomach/Bowel: Stomach normal appearance. Small bowel loops normal appearance. Cecum is not completely distended, unable to exclude mild wall thickening. Normal appendix. Remainder of colon normal appearance.  Vascular/Lymphatic: Vascular structures unremarkable. No adenopathy.  Reproductive: Unremarkable ovaries. Enlarged postpartum  uterus. Fluid within the endometrial canal extending into endocervical canal. Fluid  collection in the anterior uterine wall at the site of the casearian section measuring 2.8 x 2.8 x approximately 5.5 cm diameter. Additional area of inhomogeneous decreased enhancement is seen at the posterior aspect of the uterine fundus. No areas of abnormal increased enhancement are seen within the endometrial canal. Findings are most suspicious for combination of postoperative changes with potentially infection endometritis/myometritis. No definite discretely visualize retained products of conception are seen though not excluded.  Other: Small amount of free fluid in pelvis. No free air. No hernia. Infiltrative changes of the anterior pelvic wall from preceding surgery without enhancing postoperative collection.  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  IMPRESSION: Significant fluid within the endometrial and endocervical canals with a focal fluid collection at the anterior uterine wall 2.8 x 2.8 x 5.5 cm associated with inhomogeneous decreased enhancement of the uterine wall to fundus, findings overall most suspicious for infection endometritis/myometritis.  No definite enhancement is seen to suggest retained products of conception though not completely excluded.  Free fluid in pelvis without focal abscess collection.   Electronically Signed By: Ulyses SouthwardMark Boles M.D. On: 07/07/2017 16:59      Treatments: IV hydration and antibiotics: gentamycin and clindamycin  Discharge Exam: Blood pressure (!) 98/57, pulse 82, temperature 98.1 F (36.7 C), temperature source Oral, resp. rate 16, height 5\' 2"  (1.575 m), weight 55.8 kg (123 lb), last menstrual period 09/24/2016, SpO2 97 %, unknown if currently breastfeeding. General appearance: alert, cooperative and no distress GI: soft, non-tender; bowel sounds normal; no masses,  no organomegaly Extremities: extremities normal, atraumatic, no cyanosis or edema  Disposition: 01-Home or  Self Care   Allergies as of 07/09/2017      Reactions   Penicillins Nausea And Vomiting   Has patient had a PCN reaction causing immediate rash, facial/tongue/throat swelling, SOB or lightheadedness with hypotension: No Has patient had a PCN reaction causing severe rash involving mucus membranes or skin necrosis: No Has patient had a PCN reaction that required hospitalization: No Has patient had a PCN reaction occurring within the last 10 years: No If all of the above answers are "NO", then may proceed with Cephalosporin use.      Medication List    TAKE these medications   ferrous sulfate 325 (65 FE) MG tablet Take 1 tablet (325 mg total) by mouth 2 (two) times daily with a meal.   ibuprofen 600 MG tablet Commonly known as:  ADVIL,MOTRIN Take 1 tablet (600 mg total) by mouth every 6 (six) hours as needed.   oxyCODONE 5 MG immediate release tablet Commonly known as:  Oxy IR/ROXICODONE Take 1 tablet (5 mg total) by mouth every 4 (four) hours as needed (pain scale 4-7).      Follow-up Information    [facility] up on 07/15/2017.   Specialty:  Obstetrics and Gynecology Why:  incision check Contact information: [address] [place] [id number] [phone]          Signed: Scheryl DarterJames Latoshia Monrroy 07/09/2017, 10:20 AM

## 2017-07-15 ENCOUNTER — Other Ambulatory Visit: Payer: Medicaid Other

## 2017-07-15 NOTE — Progress Notes (Unsigned)
Patient presented to the office today for incision check. Patient reports feeling better at this time. Patient delivered on

## 2017-08-05 ENCOUNTER — Ambulatory Visit (INDEPENDENT_AMBULATORY_CARE_PROVIDER_SITE_OTHER): Payer: Self-pay | Admitting: Clinical

## 2017-08-05 ENCOUNTER — Ambulatory Visit (INDEPENDENT_AMBULATORY_CARE_PROVIDER_SITE_OTHER): Payer: Medicaid Other | Admitting: Obstetrics & Gynecology

## 2017-08-05 ENCOUNTER — Encounter: Payer: Self-pay | Admitting: Obstetrics & Gynecology

## 2017-08-05 DIAGNOSIS — F4325 Adjustment disorder with mixed disturbance of emotions and conduct: Secondary | ICD-10-CM

## 2017-08-05 DIAGNOSIS — O99345 Other mental disorders complicating the puerperium: Secondary | ICD-10-CM

## 2017-08-05 DIAGNOSIS — O24419 Gestational diabetes mellitus in pregnancy, unspecified control: Secondary | ICD-10-CM

## 2017-08-05 DIAGNOSIS — F53 Postpartum depression: Secondary | ICD-10-CM

## 2017-08-05 MED ORDER — ESCITALOPRAM OXALATE 10 MG PO TABS
10.0000 mg | ORAL_TABLET | Freq: Every day | ORAL | 3 refills | Status: DC
Start: 2017-08-05 — End: 2022-12-08

## 2017-08-05 NOTE — Addendum Note (Signed)
Addended by: Jaynie CollinsANYANWU, Shondell Fabel A on: 08/05/2017 04:31 PM   Modules accepted: Orders

## 2017-08-05 NOTE — BH Specialist Note (Signed)
Integrated Behavioral Health Initial Visit  MRN: [id number] Name: Shannon Archer  Number of Integrated Behavioral Health Clinician visits:: 1/6 Session Start time: 1:10  Session End time: 2:10 Total time: 1 hour  Type of Service: Integrated Behavioral Health- Individual/Family Interpretor:No. Interpretor Name and Language: n/a   Warm Hand Off Completed.       SUBJECTIVE: Shannon Archer is a 32 y.o. female accompanied by n/a Patient was referred by Dr Macon LargeAnyanwu for depression and anxiety. Patient reports the following symptoms/concerns: Pt states her primary concern today is being easily irritated and mood instability, along with an increase in depression and anxiety postpartum, that she has not experienced prior to this birth. Pt states that this birth was more difficult emotionally than previous. No panic attacks.  Duration of problem: Postpartum; Severity of problem: moderately severe  OBJECTIVE: Mood: Anxious and Depressed and Affect: Appropriate Risk of harm to self or others: No plan to harm self or others  LIFE CONTEXT: Family and Social: Lives with FOB, 33yo and newborn School/Work: Returns to work in two weeks Self-Care: Recognizing need for greater self-care Life Changes: Recent childbirth  GOALS ADDRESSED: Patient will: 1. Reduce symptoms of: anxiety, depression and mood instability 2. Increase knowledge and/or ability of: self-management skills  3. 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  PLAN: 1. Follow up with behavioral health clinician on : One week 2. Behavioral recommendations:  -CALM relaxation breathing exercise every morning; as needed throughout the day -Consider apps for additional self-coping/distraction -Read educational material regarding coping with symptoms of anxiety and depression 3. Referral(s): Integrated [facility] (In Clinic) 4. "From scale of 1-10, how likely are you to follow plan?": 9  Rae LipsJamie C Faizon Capozzi, LCSW  Depression screen [facility] 2/[facility] 08/05/2017 04/16/2017  Decreased Interest 2 0  Down, Depressed, Hopeless 2 0  PHQ - 2 Score 4 0  Altered sleeping 1 -  Tired, decreased energy 1 -  Change in appetite 2 -  Feeling bad or failure about yourself  3 -  Trouble concentrating 2 -  Moving slowly or fidgety/restless 2 -  Suicidal thoughts 0 -  PHQ-9 Score 15 -  Difficult doing work/chores Somewhat difficult -   GAD 7 : Generalized Anxiety Score 08/05/2017  Nervous, Anxious, on Edge 3  Control/stop worrying 3  Worry too much - different things 3  Trouble relaxing 3  Restless 2  Easily annoyed or irritable 3  Afraid - awful might happen 3  Total GAD 7 Score 20

## 2017-08-05 NOTE — Progress Notes (Addendum)
Post Partum Exam  Shannon Archer is a 32 y.o. [phone] female who presents for a postpartum visit. She is 6 weeks postpartum following a low cervical transverse Cesarean section. I have fully reviewed the prenatal and intrapartum course; she had A1GDM. The delivery was at 39.0 gestational weeks.  Anesthesia: spinal. Postpartum course has been complicated by endometritis. Baby's course has been unremarkable. Baby is feeding by bottle Rush Barer- Gerber. Bleeding pink discharge. Bowel function is normal. Bladder function is normal. Patient is sexually active. Contraception method is tubal ligation. Postpartum depression screening: Positive. Score=21. Denies SI/HI. Just feels very frustrated, angry and has social concerns.  The following portions of the patient's history were reviewed and updated as appropriate: allergies, current medications, past family history, past medical history, past social history, past surgical history and problem list. Last pap smear done 01/14/2017 and was normal.  Review of Systems Pertinent items noted in HPI and remainder of comprehensive ROS otherwise negative.    Objective:  Blood pressure 117/69, pulse 91, height 5\' 2"  (1.575 m), weight 120 lb (54.4 kg), not currently breastfeeding.  General:  alert and no distress   Breasts:  deferred  Lungs: clear to auscultation bilaterally  Heart:  regular rate and rhythm  Abdomen: soft, non-tender; bowel sounds normal; no masses,  no organomegaly. Incision C/D/I. Well- healed  Pelvic:  not evaluated        Assessment:   Here for postpartum exam. Pap smear not done at today's visit.   Plan:   1. Contraception: tubal ligation 2. Postpartum depression: Denies HI/SI. Patient verbally consented to [facility] about presenting concerns. Appointment made for today at 1 pm.  It is likely she will need medication, will follow up recommendations.  3. GDM: 2 hr GTT done today, will follow up results and manage  accordingly. 4. Follow up as needed.     Jaynie CollinsUGONNA  Almena Hokenson, MD, FACOG Obstetrician & Gynecologist, [facility], [facility]

## 2017-08-05 NOTE — Progress Notes (Signed)
Lexapro prescribed as per counselor recommendation. Will observe response.  Shannon CollinsUGONNA  ANYANWU, MD, FACOG Obstetrician & Gynecologist, [facility], [facility]

## 2017-08-05 NOTE — Patient Instructions (Signed)
Return to clinic for any scheduled appointments or for any gynecologic concerns as needed.   

## 2017-08-06 ENCOUNTER — Encounter: Payer: Self-pay | Admitting: Radiology

## 2017-08-07 ENCOUNTER — Telehealth: Payer: Self-pay | Admitting: Clinical

## 2017-08-07 LAB — GLUCOSE TOLERANCE, 2 HOURS
Glucose, 2 hour: 51 mg/dL — ABNORMAL LOW (ref 65–139)
Glucose, GTT - Fasting: 82 mg/dL (ref 65–99)

## 2017-08-07 NOTE — Telephone Encounter (Signed)
Integrated Behavioral Health Medication Management Phone Note  MRN: [id number] NAME: Shannon Archer  Time Call Initiated: 4:40 PM  Time Call Completed: 4:44 PM  Total Call Time: 4 minutes  Current Medications:  Outpatient Medications Prior to Visit  Medication Sig Dispense Refill  . escitalopram (LEXAPRO) 10 MG tablet Take 1 tablet (10 mg total) by mouth daily. 30 tablet 3  . ferrous sulfate 325 (65 FE) MG tablet Take 1 tablet (325 mg total) by mouth 2 (two) times daily with a meal. (Patient not taking: Reported on 07/07/2017) 60 tablet 3  . ibuprofen (ADVIL,MOTRIN) 600 MG tablet Take 1 tablet (600 mg total) by mouth every 6 (six) hours as needed. (Patient not taking: Reported on 07/15/2017) 30 tablet 0  . oxyCODONE (OXY IR/ROXICODONE) 5 MG immediate release tablet Take 1 tablet (5 mg total) by mouth every 4 (four) hours as needed (pain scale 4-7). (Patient not taking: Reported on 07/15/2017) 50 tablet 0   No facility-administered medications prior to visit.     Patient has been able to get all medications filled as prescribed: Yes  Patient is currently taking all medications as prescribed: Yes  Patient reports experiencing side effects: Yes- A little jittery today  Patient describes feeling this way on medications:" I felt a little jittery in my hands today when I got up and moved around, but when I sat down, I could feel it calming down"  Additional patient concerns: "none today"  Patient advised to schedule appointment with provider for evaluation of medication side effects or additional concerns: No [facility]   Rae LipsJamie C Charis Juliana, LCSW

## 2017-08-11 ENCOUNTER — Encounter: Payer: Self-pay | Admitting: Obstetrics & Gynecology

## 2017-08-12 ENCOUNTER — Ambulatory Visit (INDEPENDENT_AMBULATORY_CARE_PROVIDER_SITE_OTHER): Payer: Medicaid Other | Admitting: Clinical

## 2017-08-12 DIAGNOSIS — F4325 Adjustment disorder with mixed disturbance of emotions and conduct: Secondary | ICD-10-CM

## 2017-08-12 NOTE — BH Specialist Note (Addendum)
Integrated Behavioral Health Follow Up Visit  MRN: [id number] Name: Shannon Archer  Number of Integrated Behavioral Health Clinician visits: 2/6 Session Start time: 1:10  Session End time: 1:41  Total time: 30 minutes  Type of Service: Integrated Behavioral Health- Individual/Family Interpretor:No. Interpretor Name and Language: n/a  SUBJECTIVE: Shannon Archer is a 32 y.o. female accompanied by n/a Patient was referred by Dr Macon LargeAnyanwu for depression and anxiety Patient reports the following symptoms/concerns: Pt states that the Lexapro seems to be helping with her irritability, and  feels a decrease in depression and anxiety, as if the medication "takes the edge off", and feels more manageable. Pt looks forward to easing back to work on Thursday, and [facility], to continue with ongoing therapy and BH med management.  Duration of problem: postpartum; Severity of problem: moderate  OBJECTIVE: Mood: Appropriate and Affect: Appropriate Risk of harm to self or others: No plan to harm self or others  LIFE CONTEXT: Family and Social: Pt lives with FOB, 8yo; newborn son School/Work: Returning to work part-time in two days Self-Care: Daily breathing exercises; learning to "let go" of stress Life Changes: Recent childbirth  GOALS ADDRESSED: Patient will: 1.  Reduce symptoms of: agitation, anxiety and depression  2.  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 Follow up with behavioral health clinician on : As needed, if  symptoms remain or increase 2. Behavioral recommendations:  -Continue doing CALM relaxation breathing exercise daily -Continue taking BH medication, as prescribed by medical provider -[facility] within one month, prior to insurance ending, to [facility] 3. Referral(s): Integrated [facility]al [facility] Services (In Clinic) and [facility] (L[facility]/Outside Clinic) 4. "From scale of 1-10, how likely are you to follow plan?": 10  Rae LipsJamie C Janelle Culton, LCSW  Depression screen [facility] 2/9 08/12/2017 08/05/2017 04/16/2017  Decreased Interest 2 2 0  Down, Depressed, Hopeless 1 2 0  PHQ - 2 Score 3 4 0  Altered sleeping 2 1 -  Tired, decreased energy 2 1 -  Change in appetite 1 2 -  Feeling bad or failure about yourself  2 3 -  Trouble concentrating 2 2 -  Moving slowly or fidgety/restless 2 2 -  Suicidal thoughts 0 0 -  PHQ-9 Score 14 15 -  Difficult doing work/chores - Somewhat difficult -   GAD 7 : Generalized Anxiety Score 08/12/2017 08/05/2017  Nervous, Anxious, on Edge 2 3  Control/stop worrying 3 3  Worry too much - different things 3 3  Trouble relaxing 2 3  Restless 2 2  Easily annoyed or irritable 3 3  Afraid - awful might happen 3 3  Total GAD 7 Score 18 20

## 2018-03-16 IMAGING — US US PELVIS COMPLETE
1 series · 15 of 24 positions shown · non-contrast
Comparison: None.

CLINICAL DATA: Right-sided pelvic pain for 1 day. Patient is about
2 weeks postpartum from Cesarean section.

EXAM:
TRANSABDOMINAL ULTRASOUND OF PELVIS
TECHNIQUE: Transabdominal ultrasound examination of the pelvis was performed
including evaluation of the uterus, ovaries, adnexal regions, and
pelvic cul-de-sac.

[Series 1: us pelvis complete · 24 acquisitions, 15 frames shown]
[im 1/24]
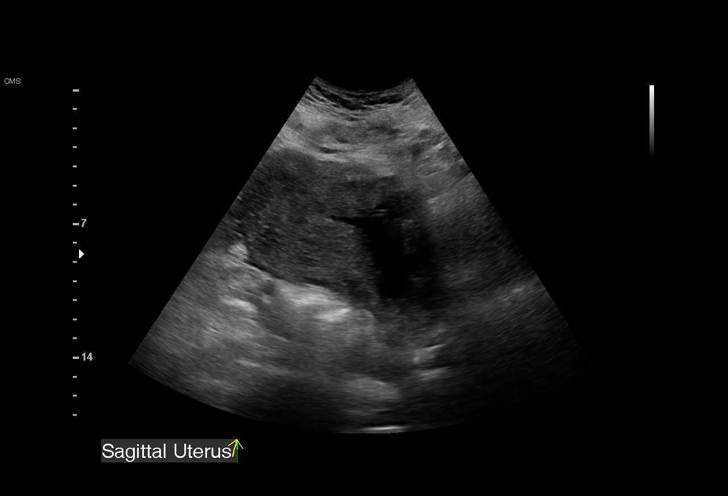
[im 3/24]
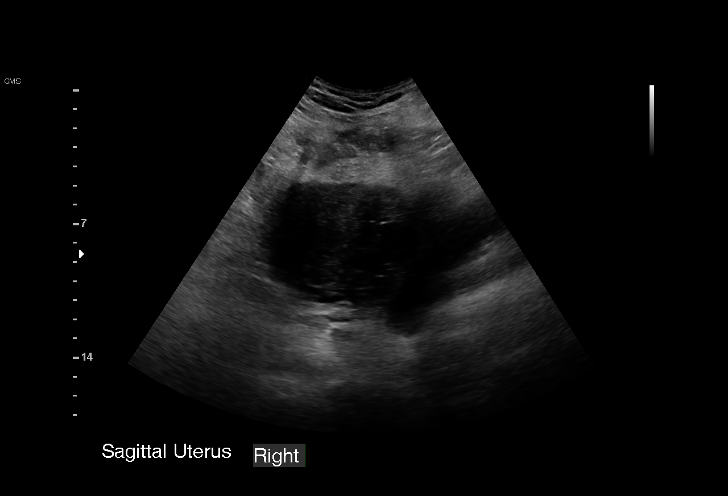
[im 5/24]
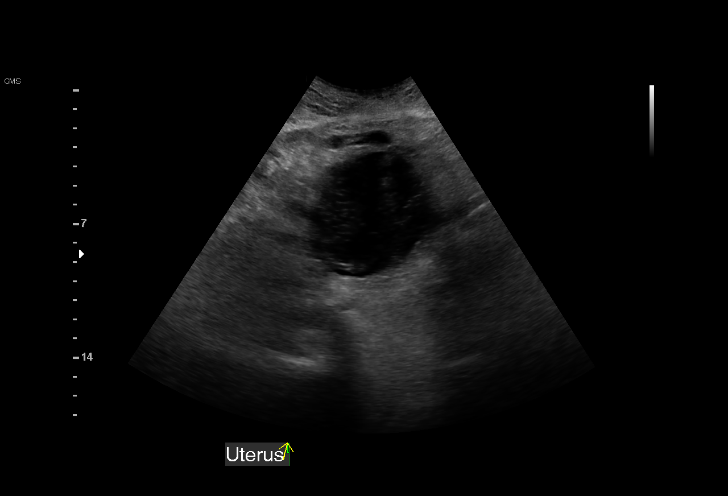
[im 6/24]
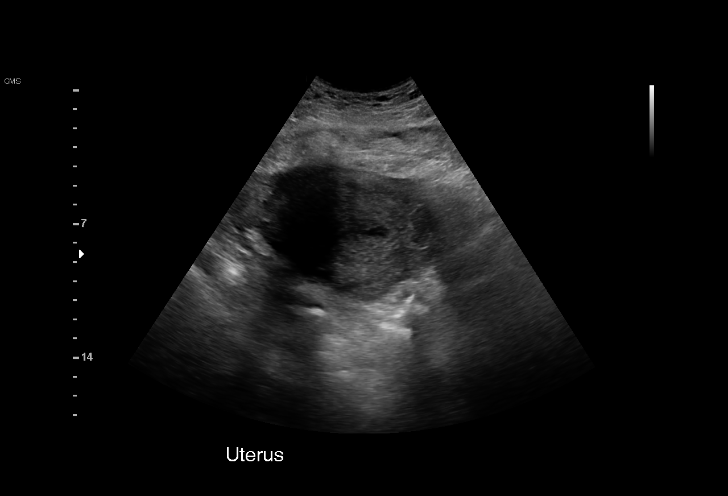
[im 8/24]
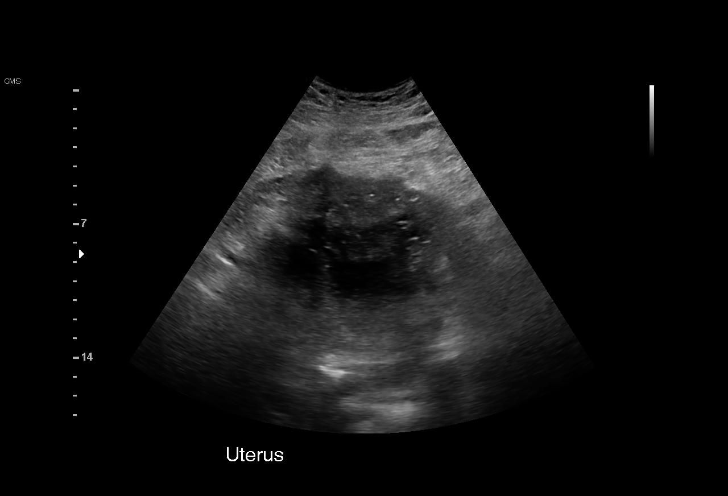
[im 9/24]
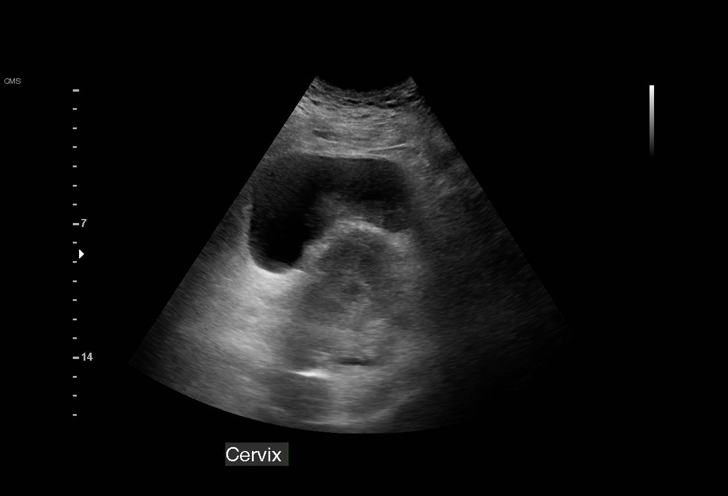
[im 11/24]
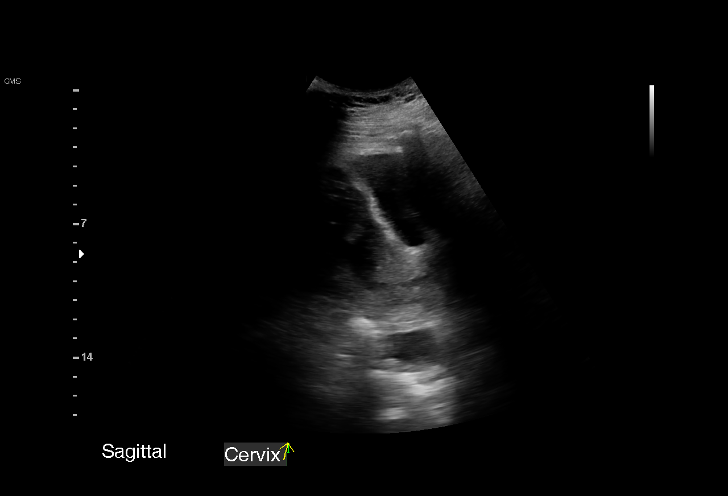
[im 13/24]
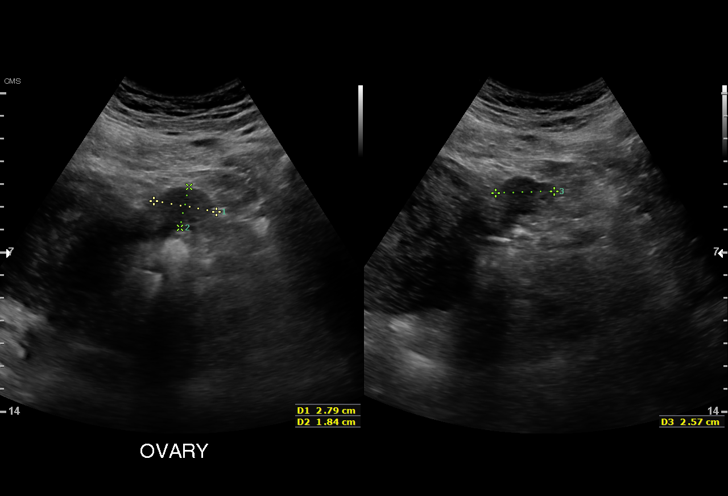
[im 14/24]
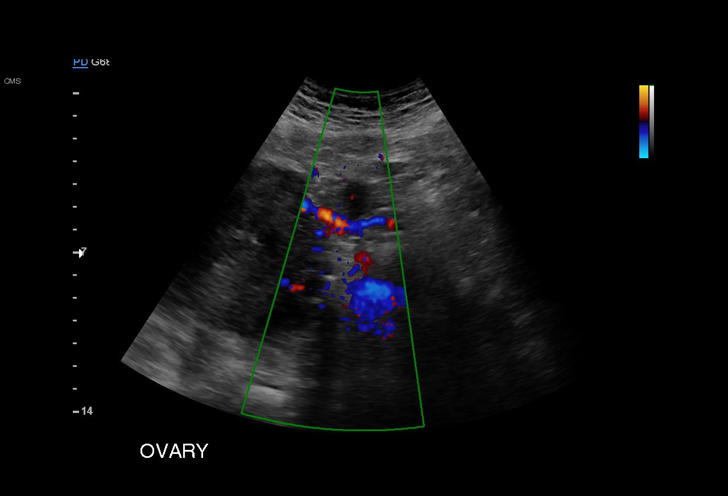
[im 16/24]
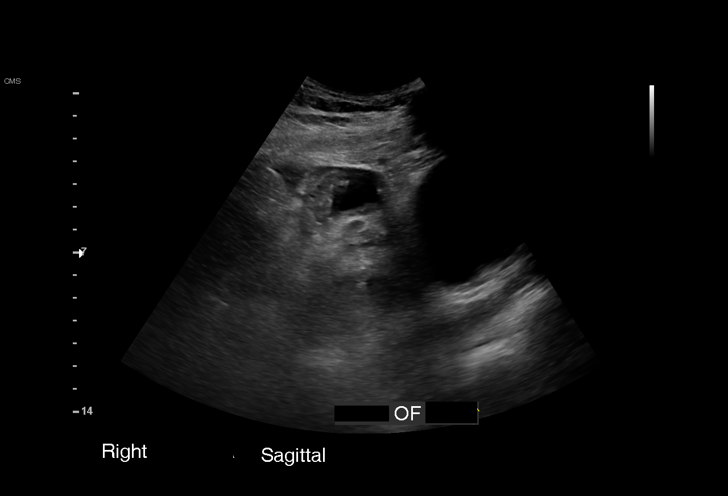
[im 17/24]
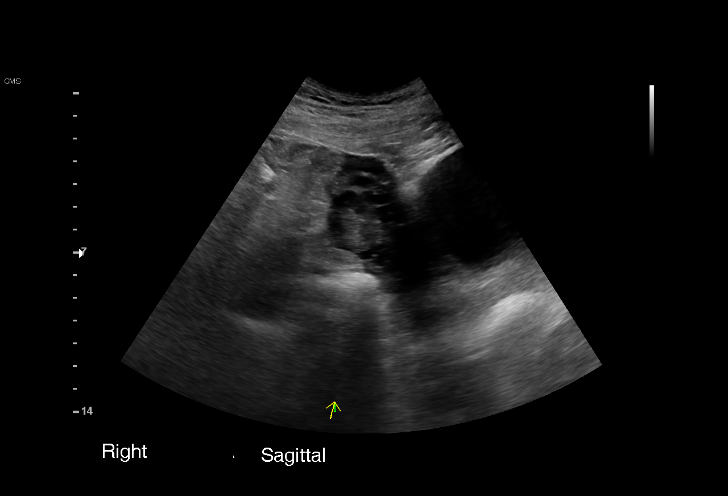
[im 19/24]
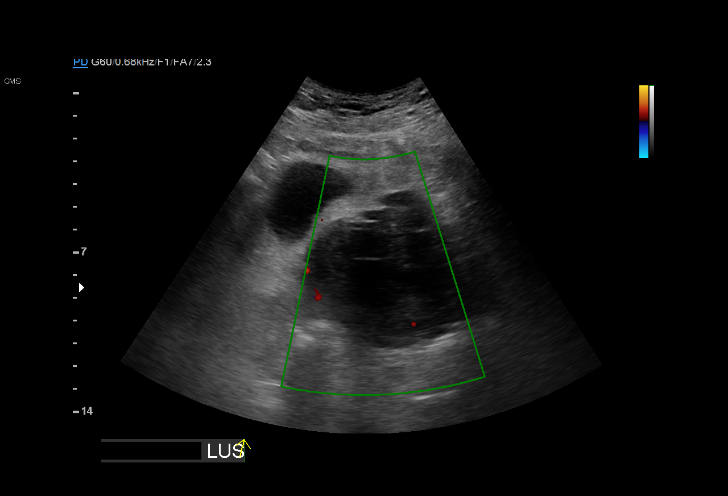
[im 21/24]
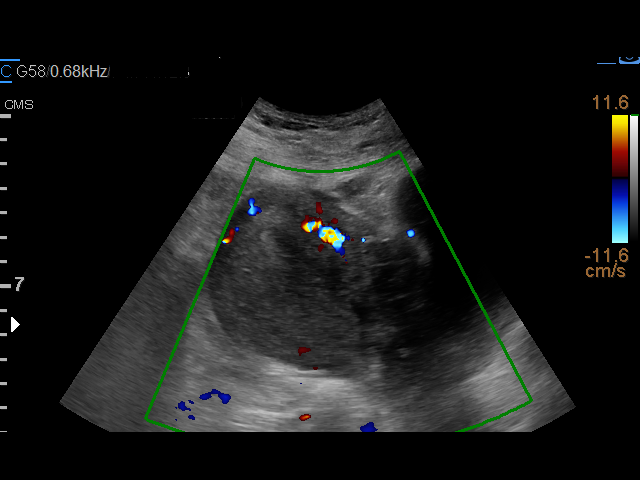
[im 22/24]
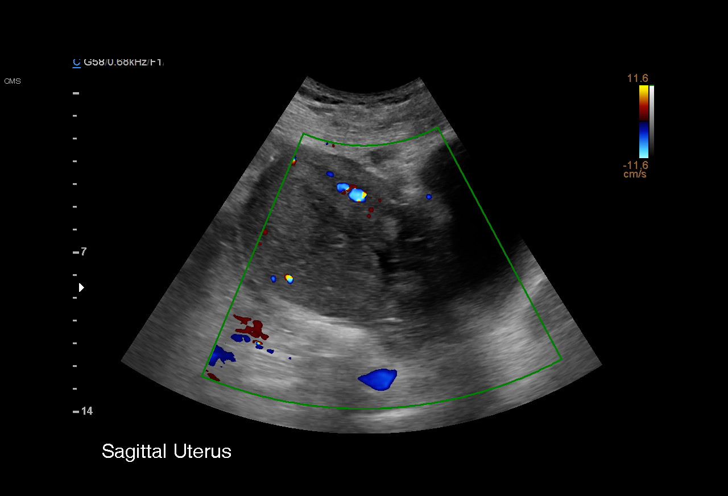
[im 24/24]
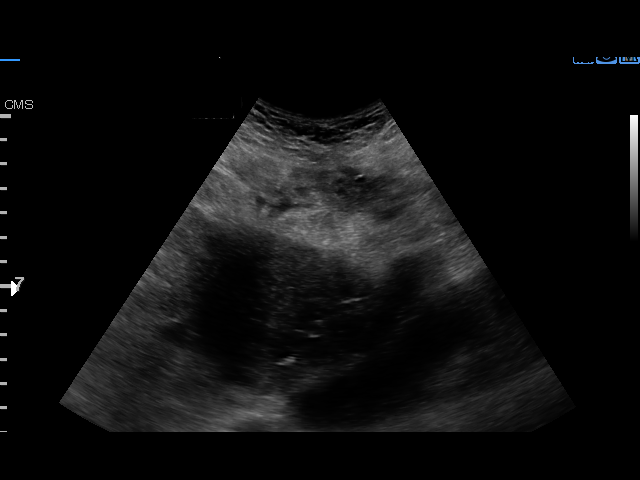

[15 of 24 positions shown; findings below may reference images not displayed]

FINDINGS: Study is limited by bandage material overlying the low transverse
incision.

Uterus

Measurements: 14.1 x 6.9 x 9.5 cm. Complex structures identified in
the region of the lower uterine segment but the relationship to the
uterus is not clearly delineated on this study. This may be in the
wall of the anterior lower uterine segment. Lesion measures about 6
cm in shows internal reticular echoes consistent with complex
lesion.

Endometrium

Not visualized.

Right ovary

Measurements: Nonvisualized.. Sonographer initially interpreted
vascular anatomy is the ovary but was not confident this represented
ovarian parenchyma.

Left ovary

Measurements: 2.8 x 2.6 x 1.8 cm. Normal appearance/no adnexal mass.

Other findings: Free fluid is identified in the right adnexal
region.
IMPRESSION: 1. Apparent complex cystic lesion in the region of the lower uterine
segment, potentially in the wall of the lower uterus. This does
appear to be distinct from the bladder dome and could be a hematoma
although superinfected fluid collection/abscess is not excluded.
Study limited by poor acoustic window due to bandage material
overlying the low transverse incision. If there is clinical concern
for hematoma or abscess, CT imaging of the abdomen and pelvis with
oral and intravenous contrast would be the study of choice to
further evaluate.

## 2018-11-02 ENCOUNTER — Telehealth: Payer: Self-pay | Admitting: *Deleted

## 2018-11-02 MED ORDER — PHENAZOPYRIDINE HCL 200 MG PO TABS: 200.0000 mg | ORAL_TABLET | Freq: Three times a day (TID) | ORAL | 0 refills | Status: DC | PRN

## 2018-11-02 MED ORDER — NITROFURANTOIN MONOHYD MACRO 100 MG PO CAPS: 100.0000 mg | ORAL_CAPSULE | Freq: Two times a day (BID) | ORAL | 0 refills | Status: DC

## 2018-11-02 NOTE — Telephone Encounter (Signed)
Pt requesting meds for UTI, sent in per protocol.

## 2018-11-02 NOTE — Telephone Encounter (Signed)
-----   Message from Rozann Lesches, NT sent at 11/02/2018  2:03 PM EDT ----- Regarding: UTI Please send med for [facility] to [facility] [place], burning when urinating, feels she can't empty bladder.

## 2020-02-03 ENCOUNTER — Ambulatory Visit: Payer: Self-pay

## 2020-02-03 ENCOUNTER — Encounter (HOSPITAL_COMMUNITY): Payer: Self-pay

## 2020-02-03 ENCOUNTER — Ambulatory Visit (INDEPENDENT_AMBULATORY_CARE_PROVIDER_SITE_OTHER): Payer: Medicaid Other

## 2020-02-03 ENCOUNTER — Ambulatory Visit (HOSPITAL_COMMUNITY)
Admission: EM | Admit: 2020-02-03 | Discharge: 2020-02-03 | Disposition: A | Discharge status: Home / Self Care | Payer: Medicaid Other | Attending: Internal Medicine | Admitting: Internal Medicine

## 2020-02-03 ENCOUNTER — Other Ambulatory Visit: Payer: Self-pay

## 2020-02-03 DIAGNOSIS — F17210 Nicotine dependence, cigarettes, uncomplicated: Secondary | ICD-10-CM | POA: Diagnosis not present

## 2020-02-03 DIAGNOSIS — U071 COVID-19: Secondary | ICD-10-CM | POA: Diagnosis present

## 2020-02-03 DIAGNOSIS — J209 Acute bronchitis, unspecified: Secondary | ICD-10-CM

## 2020-02-03 DIAGNOSIS — Z98891 History of uterine scar from previous surgery: Secondary | ICD-10-CM | POA: Diagnosis not present

## 2020-02-03 DIAGNOSIS — Z79899 Other long term (current) drug therapy: Secondary | ICD-10-CM | POA: Insufficient documentation

## 2020-02-03 DIAGNOSIS — F17200 Nicotine dependence, unspecified, uncomplicated: Secondary | ICD-10-CM | POA: Diagnosis not present

## 2020-02-03 DIAGNOSIS — Z8759 Personal history of other complications of pregnancy, childbirth and the puerperium: Secondary | ICD-10-CM | POA: Diagnosis not present

## 2020-02-03 MED ORDER — DOXYCYCLINE HYCLATE 100 MG PO CAPS: 100.0000 mg | ORAL_CAPSULE | Freq: Two times a day (BID) | ORAL | 0 refills | Status: AC

## 2020-02-03 MED ORDER — GUAIFENESIN ER 600 MG PO TB12: 600.0000 mg | ORAL_TABLET | Freq: Two times a day (BID) | ORAL | 0 refills | Status: AC

## 2020-02-03 MED ORDER — BENZONATATE 100 MG PO CAPS: 100.0000 mg | ORAL_CAPSULE | Freq: Three times a day (TID) | ORAL | 0 refills | Status: DC

## 2020-02-03 MED ORDER — ALBUTEROL SULFATE HFA 108 (90 BASE) MCG/ACT IN AERS: 1.0000 | INHALATION_SPRAY | Freq: Four times a day (QID) | RESPIRATORY_TRACT | 0 refills | Status: AC | PRN

## 2020-02-03 NOTE — ED Triage Notes (Signed)
Pt presents with ongoing congestion, fatigue, and headache for over a week.

## 2020-02-03 NOTE — ED Provider Notes (Signed)
[facility]    CSN: [id number] Arrival date & time: 02/03/20  1551      History   Chief Complaint Chief Complaint  Patient presents with  . Headache  . Fatigue  . Congestion    HPI Shannon Archer is a 34 y.o. female comes to the urgent care with complaint of generalized fatigue, headache, cough productive of thick greenish-yellowish sputum of 1 week duration.  Patient symptoms started insidiously and is gotten progressively worse.  Patient denies any wheezing but admits to occasional shortness of breath.  No chest pain or chest pressure.  No chest tightness.  Patient smokes about half a pack a day and she has over 10-pack-year history of smoking.  At the outset of her symptoms she had some febrile episodes but that seems to have subsided.  No nausea, vomiting or diarrhea.Marland Kitchen   HPI  Past Medical History:  Diagnosis Date  . Gestational diabetes 2018   Normal 2 hr PP GTT  . History of preterm labor   . Tobacco abuse 01/14/2017    There are no problems to display for this patient.   Past Surgical History:  Procedure Laterality Date  . CESAREAN SECTION  2011   Breech  . CESAREAN SECTION WITH BILATERAL TUBAL LIGATION N/A 06/24/2017   Procedure: CESAREAN SECTION WITH BILATERAL TUBAL LIGATION;  Surgeon: Levie Heritage, DO;  Location: [facility];  Service: Obstetrics;  Laterality: N/A;  . DILATION AND EVACUATION N/A 08/11/2014   Procedure: DILATATION AND EVACUATION;  Surgeon: Juluis Mire, MD;  Location: [facility];  Service: Gynecology;  Laterality: N/A;  . NOSE SURGERY      OB History    Gravida  3   Para  2   Term  1   Preterm  1   AB  1   Living  1     SAB  1   TAB  0   Ectopic  0   Multiple  0   Live Births  1        Obstetric Comments  36wks spontaneous PTB (PPROM). Previous C/S - Breech Presentation.          Home Medications    Prior to Admission medications   Medication Sig Start Date End Date Taking? Authorizing Provider    albuterol (VENTOLIN HFA) 108 (90 Base) MCG/ACT inhaler Inhale 1 puff into the lungs every 6 (six) hours as needed for wheezing or shortness of breath. 02/03/20   Lashara Urey, Britta Mccreedy, MD  benzonatate (TESSALON) 100 MG capsule Take 1 capsule (100 mg total) by mouth every 8 (eight) hours. 02/03/20   Triston Skare, Britta Mccreedy, MD  doxycycline (VIBRAMYCIN) 100 MG capsule Take 1 capsule (100 mg total) by mouth 2 (two) times daily for 5 days. 02/03/20 02/08/20  Merrilee Jansky, MD  escitalopram (LEXAPRO) 10 MG tablet Take 1 tablet (10 mg total) by mouth daily. 08/05/17   Anyanwu, Jethro Bastos, MD  guaiFENesin (MUCINEX) 600 MG 12 hr tablet Take 1 tablet (600 mg total) by mouth 2 (two) times daily for 7 days. 02/03/20 02/10/20  Merrilee Jansky, MD  ferrous sulfate 325 (65 FE) MG tablet Take 1 tablet (325 mg total) by mouth 2 (two) times daily with a meal. Patient not taking: Reported on 07/07/2017 06/26/17 02/03/20  Arabella Merles, CNM    Family History Family History  Problem Relation Age of Onset  . Heart disease Maternal Grandmother   . Cancer Maternal Grandmother   . Diabetes Maternal  Grandmother   . Diabetes Maternal Grandfather   . Cancer Paternal Grandmother     Social History Social History   Tobacco Use  . Smoking status: Current Every Day Smoker    Packs/day: 0.50    Types: Cigarettes  . Smokeless tobacco: Never Used  Substance Use Topics  . Alcohol use: No  . Drug use: No     Allergies   Penicillins   Review of Systems Review of Systems  Constitutional: Positive for fatigue and fever. Negative for activity change and chills.  HENT: Positive for congestion. Negative for ear discharge, ear pain, sinus pressure and sinus pain.   Respiratory: Positive for cough and shortness of breath. Negative for chest tightness and wheezing.   Cardiovascular: Negative for chest pain.  Gastrointestinal: Negative for diarrhea, nausea and vomiting.  Neurological: Positive for headaches. Negative for  numbness.  Psychiatric/Behavioral: Negative for confusion and decreased concentration.     Physical Exam Triage Vital Signs ED Triage Vitals [02/03/20 1705]  Enc Vitals Group     BP (!) 124/86     Pulse Rate 81     Resp 17     Temp 98.3 F (36.8 C)     Temp Source Oral     SpO2 98 %     Weight      Height      Head Circumference      Peak Flow      Pain Score      Pain Loc      Pain Edu?      Excl. in GC?    No data found.  Updated Vital Signs BP (!) 124/86 (BP Location: Left Arm)   Pulse 81   Temp 98.3 F (36.8 C) (Oral)   Resp 17   LMP 01/16/2020   SpO2 98%   Visual Acuity Right Eye Distance:   Left Eye Distance:   Bilateral Distance:    Right Eye Near:   Left Eye Near:    Bilateral Near:     Physical Exam Vitals and nursing note reviewed.  Cardiovascular:     Heart sounds: Normal heart sounds.  Pulmonary:     Effort: Pulmonary effort is normal.     Breath sounds: Normal breath sounds. No wheezing or rhonchi.  Chest:     Chest wall: No tenderness.  Abdominal:     General: Bowel sounds are normal.     Palpations: Abdomen is soft.  Musculoskeletal:        General: Normal range of motion.  Skin:    General: Skin is warm.     Capillary Refill: Capillary refill takes less than 2 seconds.  Neurological:     Mental Status: She is alert.     GCS: GCS eye subscore is 4. GCS verbal subscore is 5. GCS motor subscore is 6.      UC Treatments / Results  Labs (all labs ordered are listed, but only abnormal results are displayed) Labs Reviewed  SARS CORONAVIRUS 2 (TAT 6-24 HRS)    EKG   Radiology DG Chest 2 View  Result Date: 02/03/2020 CLINICAL DATA:  Acute bronchitis. EXAM: CHEST - 2 VIEW COMPARISON:  None. FINDINGS: The heart size and mediastinal contours are within normal limits. Both lungs are clear. The visualized skeletal structures are unremarkable. IMPRESSION: No active cardiopulmonary disease. Electronically Signed   By: Lupita Raider  M.D.   On: 02/03/2020 18:23    Procedures Procedures (including critical care time)  Medications Ordered in  UC Medications - No data to display  Initial Impression / Assessment and Plan / UC Course  I have reviewed the triage vital signs and the nursing notes.  Pertinent labs & imaging results that were available during my care of the patient were reviewed by me and considered in my medical decision making (see chart for details).     1.  Acute bronchitis in the setting of chronic tobacco use: Albuterol as needed for shortness of breath Doxycycline 100 mg twice daily for 5 days No indication for steroids since patient is no wheezing on physical exam Tessalon Perles as needed for cough Mucinex 600 mg twice daily Return precautions given Smoke cessation counseling given.  Patient is in the contemplative stage of smoke cessation.  Time for smoke cessation discussion is less than 10 minutes. Final Clinical Impressions(s) / UC Diagnoses   Final diagnoses:  Acute bronchitis, unspecified organism  Tobacco use disorder   Discharge Instructions   None    ED Prescriptions    Medication Sig Dispense Auth. Provider   doxycycline (VIBRAMYCIN) 100 MG capsule Take 1 capsule (100 mg total) by mouth 2 (two) times daily for 5 days. 10 capsule [place] Dohmen, Britta Mccreedy, MD   albuterol (VENTOLIN HFA) 108 (90 Base) MCG/ACT inhaler Inhale 1 puff into the lungs every 6 (six) hours as needed for wheezing or shortness of breath. 18 g Goldia Ligman, Britta Mccreedy, MD   benzonatate (TESSALON) 100 MG capsule Take 1 capsule (100 mg total) by mouth every 8 (eight) hours. 21 capsule Keli Buehner, Britta Mccreedy, MD   guaiFENesin (MUCINEX) 600 MG 12 hr tablet Take 1 tablet (600 mg total) by mouth 2 (two) times daily for 7 days. 14 tablet Raechal Raben, Britta Mccreedy, MD     PDMP not reviewed this encounter.   Merrilee Jansky, MD 02/03/20 [phone]

## 2020-02-04 LAB — SARS CORONAVIRUS 2 (TAT 6-24 HRS): SARS Coronavirus 2: POSITIVE — AB

## 2020-02-06 ENCOUNTER — Telehealth: Payer: Self-pay | Admitting: Adult Health

## 2020-02-06 NOTE — Telephone Encounter (Signed)
Called and LMOM regarding monoclonal antibody treatment for COVID 19 given to those who are at risk for complications and/or hospitalization of the virus.  Unsure if patient meets criteria  Call back number given: [phone]  My chart message: sent  Lillard Anes, NP

## 2021-06-30 ENCOUNTER — Ambulatory Visit (HOSPITAL_COMMUNITY)
Admission: EM | Admit: 2021-06-30 | Discharge: 2021-06-30 | Disposition: A | Discharge status: Home / Self Care | Payer: Medicaid Other | Attending: Physician Assistant | Admitting: Physician Assistant

## 2021-06-30 ENCOUNTER — Telehealth (HOSPITAL_COMMUNITY): Payer: Self-pay | Admitting: Emergency Medicine

## 2021-06-30 ENCOUNTER — Ambulatory Visit (HOSPITAL_COMMUNITY)
Admission: EM | Admit: 2021-06-30 | Discharge: 2021-06-30 | Discharge status: Against Medical Advice | Payer: Medicaid Other

## 2021-06-30 ENCOUNTER — Encounter (HOSPITAL_COMMUNITY): Payer: Self-pay | Admitting: Emergency Medicine

## 2021-06-30 ENCOUNTER — Telehealth: Payer: Medicaid Other | Admitting: Nurse Practitioner

## 2021-06-30 DIAGNOSIS — K047 Periapical abscess without sinus: Secondary | ICD-10-CM

## 2021-06-30 MED ORDER — CLINDAMYCIN HCL 300 MG PO CAPS
300.0000 mg | ORAL_CAPSULE | Freq: Three times a day (TID) | ORAL | 0 refills | Status: DC
Start: 2021-06-30 — End: 2021-06-30

## 2021-06-30 MED ORDER — CLINDAMYCIN HCL 300 MG PO CAPS: 300.0000 mg | ORAL_CAPSULE | Freq: Three times a day (TID) | ORAL | 0 refills | Status: AC

## 2021-06-30 NOTE — ED Provider Notes (Signed)
[facility]    CSN: [id number] Arrival date & time: 06/30/21  1226      History   Chief Complaint Chief Complaint  Patient presents with   Dental Pain    HPI Shannon Archer is a 35 y.o. female.   Patient here today for evaluation of likely dental abscess to incisors.  She reports that she has poor dentition throughout but has noticed more pain to her upper front gums that seems to be spreading into her face.  She has not had fever.  She does not report treatment for symptoms.  The history is provided by the patient.  Dental Pain Associated symptoms: no fever    Past Medical History:  Diagnosis Date   Gestational diabetes 2018   Normal 2 hr PP GTT   History of preterm labor    Tobacco abuse 01/14/2017    There are no problems to display for this patient.   Past Surgical History:  Procedure Laterality Date   CESAREAN SECTION  2011   [place]   CESAREAN SECTION WITH BILATERAL TUBAL LIGATION N/A 06/24/2017   Procedure: CESAREAN SECTION WITH BILATERAL TUBAL LIGATION;  Surgeon: Levie Heritage, DO;  Location: [facility];  Service: Obstetrics;  Laterality: N/A;   DILATION AND EVACUATION N/A 08/11/2014   Procedure: DILATATION AND EVACUATION;  Surgeon: Juluis Mire, MD;  Location: [facility];  Service: Gynecology;  Laterality: N/A;   NOSE SURGERY      OB History     Gravida  3   Para  2   Term  1   Preterm  1   AB  1   Living  1      SAB  1   IAB  0   Ectopic  0   Multiple  0   Live Births  1        Obstetric Comments  36wks spontaneous PTB (PPROM). Previous C/S - Breech Presentation.           Home Medications    Prior to Admission medications   Medication Sig Start Date End Date Taking? Authorizing Provider  clindamycin (CLEOCIN) 300 MG capsule Take 1 capsule (300 mg total) by mouth 3 (three) times daily for 7 days. 06/30/21 07/07/21 Yes Tomi Bamberger, PA-C  albuterol (VENTOLIN HFA) 108 (90 Base) MCG/ACT inhaler Inhale  1 puff into the lungs every 6 (six) hours as needed for wheezing or shortness of breath. 02/03/20   Lamptey, Britta Mccreedy, MD  benzonatate (TESSALON) 100 MG capsule Take 1 capsule (100 mg total) by mouth every 8 (eight) hours. 02/03/20   Merrilee Jansky, MD  escitalopram (LEXAPRO) 10 MG tablet Take 1 tablet (10 mg total) by mouth daily. 08/05/17   Anyanwu, Jethro Bastos, MD  ferrous sulfate 325 (65 FE) MG tablet Take 1 tablet (325 mg total) by mouth 2 (two) times daily with a meal. Patient not taking: Reported on 07/07/2017 06/26/17 02/03/20  Arabella Merles, CNM    Family History Family History  Problem Relation Age of Onset   Heart disease Maternal Grandmother    Cancer Maternal Grandmother    Diabetes Maternal Grandmother    Diabetes Maternal Grandfather    Cancer Paternal Grandmother     Social History Social History   Tobacco Use   Smoking status: Every Day    Packs/day: 0.50    Types: Cigarettes   Smokeless tobacco: Never  Substance Use Topics   Alcohol use: No   Drug use: No  Allergies   Penicillins   Review of Systems Review of Systems  Constitutional:  Negative for chills and fever.  HENT:  Positive for dental problem.   Eyes:  Negative for discharge and redness.  Respiratory:  Negative for shortness of breath.   Gastrointestinal:  Negative for abdominal pain, nausea and vomiting.  Genitourinary:  Positive for vaginal bleeding and vaginal discharge.    Physical Exam Triage Vital Signs ED Triage Vitals [06/30/21 1329]  Enc Vitals Group     BP (!) 115/59     Pulse Rate 86     Resp 17     Temp 98 F (36.7 C)     Temp Source Oral     SpO2 96 %     Weight      Height      Head Circumference      Peak Flow      Pain Score 8     Pain Loc      Pain Edu?      Excl. in [place]?    No data found.  Updated Vital Signs BP (!) 115/59 (BP Location: Left Arm)    Pulse 86    Temp 98 F (36.7 C) (Oral)    Resp 17    LMP 06/09/2021    SpO2 96%     Physical  Exam Vitals and nursing note reviewed.  Constitutional:      General: She is not in acute distress.    Appearance: Normal appearance. She is not ill-appearing.  HENT:     Head: Normocephalic and atraumatic.     Mouth/Throat:     Comments: Poor dentition throughout with multiple missing teeth and multiple dental caries.  Swelling to gums diffusely seemingly worse to upper incisors. Eyes:     Conjunctiva/sclera: Conjunctivae normal.  Cardiovascular:     Rate and Rhythm: Normal rate.  Pulmonary:     Effort: Pulmonary effort is normal.  Neurological:     Mental Status: She is alert.  Psychiatric:        Mood and Affect: Mood normal.        Behavior: Behavior normal.        Thought Content: Thought content normal.     UC Treatments / Results  Labs (all labs ordered are listed, but only abnormal results are displayed) Labs Reviewed - No data to display  EKG   Radiology No results found.  Procedures Procedures (including critical care time)  Medications Ordered in UC Medications - No data to display  Initial Impression / Assessment and Plan / UC Course  I have reviewed the triage vital signs and the nursing notes.  Pertinent labs & imaging results that were available during my care of the patient were reviewed by me and considered in my medical decision making (see chart for details).  Clindamycin prescribed to cover dental abscess as patient is allergic to penicillins.  Recommended follow-up with dentist as soon as possible.  Final Clinical Impressions(s) / UC Diagnoses   Final diagnoses:  Dental abscess   Discharge Instructions   None    ED Prescriptions     Medication Sig Dispense Auth. Provider   clindamycin (CLEOCIN) 300 MG capsule Take 1 capsule (300 mg total) by mouth 3 (three) times daily for 7 days. 21 capsule Francene Finders, PA-C      PDMP not reviewed this encounter.   Francene Finders, PA-C 06/30/21 1426

## 2021-06-30 NOTE — ED Triage Notes (Signed)
Pt c/o front dental abscess for a couple days that got worse last night

## 2021-08-31 ENCOUNTER — Ambulatory Visit (HOSPITAL_COMMUNITY)
Admission: EM | Admit: 2021-08-31 | Discharge: 2021-08-31 | Disposition: A | Discharge status: Home / Self Care | Payer: Medicaid Other | Attending: Nurse Practitioner | Admitting: Nurse Practitioner

## 2021-08-31 ENCOUNTER — Encounter (HOSPITAL_COMMUNITY): Payer: Self-pay | Admitting: Emergency Medicine

## 2021-08-31 ENCOUNTER — Other Ambulatory Visit: Payer: Self-pay

## 2021-08-31 DIAGNOSIS — K047 Periapical abscess without sinus: Secondary | ICD-10-CM

## 2021-08-31 MED ORDER — CLINDAMYCIN HCL 150 MG PO CAPS: 450.0000 mg | ORAL_CAPSULE | Freq: Three times a day (TID) | ORAL | 0 refills | Status: AC

## 2021-08-31 NOTE — Discharge Instructions (Addendum)
Please take all the antibiotics as prescribed.  If your symptoms do not improve with antibiotics, or if they worsen, please go to the emergency room.  Please keep your appointment with a dentist later next month.

## 2021-08-31 NOTE — ED Provider Notes (Signed)
[place]    CSN: [id number] Arrival date & time: 08/31/21  [id number]      History   Chief Complaint Chief Complaint  Patient presents with   dental abscess    HPI Shannon Archer is a 36 y.o. female.   Patient reports 2-week history of dental abscess.  She had similar issue 2 months ago and was treated in our clinic with clindamycin.  She reports her symptoms improved after treatment, however did not fully go away.  She reports her greatest area of pain is in her left front incisor.  She also feels she has an abscess forming in the back right molar.  She has an appointment with a dentist next month.  She reports the pain as sharp, throbbing, and severe at times.  The pain is not radiating anywhere.  She reports when she touches her face, it is tender.  She denies any pus or drainage, fevers, redness, swelling.  She is eating and drinking okay-no nausea, vomiting, or decreased appetite.  She has taken NSAIDs with some relief in pain.   Past Medical History:  Diagnosis Date   Gestational diabetes 2018   Normal 2 hr PP GTT   History of preterm labor    Tobacco abuse 01/14/2017    There are no problems to display for this patient.   Past Surgical History:  Procedure Laterality Date   CESAREAN SECTION  2011   [place]   CESAREAN SECTION WITH BILATERAL TUBAL LIGATION N/A 06/24/2017   Procedure: CESAREAN SECTION WITH BILATERAL TUBAL LIGATION;  Surgeon: Truett Mainland, DO;  Location: [place];  Service: Obstetrics;  Laterality: N/A;   DILATION AND EVACUATION N/A 08/11/2014   Procedure: DILATATION AND EVACUATION;  Surgeon: Darlyn Chamber, MD;  Location: [facility] [facility];  Service: Gynecology;  Laterality: N/A;   NOSE SURGERY      OB History     Gravida  3   Para  2   Term  1   Preterm  1   AB  1   Living  1      SAB  1   IAB  0   Ectopic  0   Multiple  0   Live Births  1        Obstetric Comments  36wks spontaneous PTB (PPROM). Previous C/S -  Breech Presentation.           Home Medications    Prior to Admission medications   Medication Sig Start Date End Date Taking? Authorizing Provider  clindamycin (CLEOCIN) 150 MG capsule Take 3 capsules (450 mg total) by mouth 3 (three) times daily for 7 days. 08/31/21 09/07/21 Yes Eulogio Bear, NP  albuterol (VENTOLIN HFA) 108 (90 Base) MCG/ACT inhaler Inhale 1 puff into the lungs every 6 (six) hours as needed for wheezing or shortness of breath. 02/03/20   Lamptey, Myrene Galas, MD  benzonatate (TESSALON) 100 MG capsule Take 1 capsule (100 mg total) by mouth every 8 (eight) hours. 02/03/20   Chase Picket, MD  escitalopram (LEXAPRO) 10 MG tablet Take 1 tablet (10 mg total) by mouth daily. 08/05/17   Anyanwu, Sallyanne Havers, MD  ferrous sulfate 325 (65 FE) MG tablet Take 1 tablet (325 mg total) by mouth 2 (two) times daily with a meal. Patient not taking: Reported on 07/07/2017 06/26/17 02/03/20  Myrtis Ser, CNM    Family History Family History  Problem Relation Age of Onset   Heart disease Maternal Grandmother  Cancer Maternal Grandmother    Diabetes Maternal Grandmother    Diabetes Maternal Grandfather    Cancer Paternal Grandmother     Social History Social History   Tobacco Use   Smoking status: Every Day    Packs/day: 0.50    Types: Cigarettes   Smokeless tobacco: Never  Substance Use Topics   Alcohol use: No   Drug use: No     Allergies   Penicillins   Review of Systems Review of Systems Per HPI  Physical Exam Triage Vital Signs ED Triage Vitals  Enc Vitals Group     BP 08/31/21 1058 106/77     Pulse Rate 08/31/21 1058 81     Resp 08/31/21 1058 16     Temp 08/31/21 1058 98.1 F (36.7 C)     Temp Source 08/31/21 1058 Oral     SpO2 08/31/21 1058 97 %     Weight 08/31/21 1057 119 lb 14.9 oz (54.4 kg)     Height 08/31/21 1057 5\' 2"  (1.575 m)     Head Circumference --      Peak Flow --      Pain Score 08/31/21 1057 0     Pain Loc --      Pain  Edu? --      Excl. in [place]? --    No data found.  Updated Vital Signs BP 106/77 (BP Location: Left Arm)    Pulse 81    Temp 98.1 F (36.7 C) (Oral)    Resp 16    Ht 5\' 2"  (1.575 m)    Wt 119 lb 14.9 oz (54.4 kg)    SpO2 97%    BMI 21.94 kg/m   Visual Acuity Right Eye Distance:   Left Eye Distance:   Bilateral Distance:    Right Eye Near:   Left Eye Near:    Bilateral Near:     Physical Exam Vitals and nursing note reviewed.  Constitutional:      General: She is not in acute distress.    Appearance: She is not toxic-appearing.  HENT:     Head: Normocephalic and atraumatic.     Right Ear: Tympanic membrane, ear canal and external ear normal.     Left Ear: Tympanic membrane, ear canal and external ear normal.     Nose: No congestion or rhinorrhea.     Mouth/Throat:     Mouth: Mucous membranes are dry.     Dentition: Abnormal dentition. Gingival swelling and dental caries present.     Pharynx: Uvula midline. No posterior oropharyngeal erythema.     Tonsils: No tonsillar exudate or tonsillar abscesses.     Comments: Multiple dental caries present.  Increased redness, swelling, tenderness to left incisor. Eyes:     General: No scleral icterus.    Extraocular Movements: Extraocular movements intact.  Pulmonary:     Effort: Pulmonary effort is normal. No respiratory distress.  Skin:    General: Skin is warm and dry.     Coloration: Skin is not jaundiced or pale.     Findings: No erythema.  Neurological:     Mental Status: She is alert and oriented to person, place, and time.     Motor: No weakness.  Psychiatric:        Mood and Affect: Mood normal.        Behavior: Behavior normal.     UC Treatments / Results  Labs (all labs ordered are listed, but only abnormal results are displayed) Labs  Reviewed - No data to display  EKG   Radiology No results found.  Procedures Procedures (including critical care time)  Medications Ordered in UC Medications - No data to  display  Initial Impression / Assessment and Plan / UC Course  I have reviewed the triage vital signs and the nursing notes.  Pertinent labs & imaging results that were available during my care of the patient were reviewed by me and considered in my medical decision making (see chart for details).     Given increase in pain, redness, swelling, treat for dental abscess with clindamycin.  Patient with allergy to penicillin.  Treat with clindamycin 450 mg 3 times daily for 7 days.  Discussed side effects including increased diarrhea, C. difficile.  Encouraged daily probiotic while on antibiotic treatment.  Discussed if symptoms worsen or persist despite antibiotics, go to emergency room.  Encouraged following up with dentist.  Final Clinical Impressions(s) / UC Diagnoses   Final diagnoses:  Dental abscess     Discharge Instructions      Please take all the antibiotics as prescribed.  If your symptoms do not improve with antibiotics, or if they worsen, please go to the emergency room.  Please keep your appointment with a dentist later next month.     ED Prescriptions     Medication Sig Dispense Auth. Provider   clindamycin (CLEOCIN) 150 MG capsule Take 3 capsules (450 mg total) by mouth 3 (three) times daily for 7 days. 63 capsule Eulogio Bear, NP      PDMP not reviewed this encounter.   Eulogio Bear, NP 08/31/21 1148

## 2021-08-31 NOTE — ED Triage Notes (Signed)
Pt reports multiple dental abscess starting. Pt states they have been there for the past 2 weeks and dentist appointment is not until march.

## 2022-03-19 ENCOUNTER — Encounter (HOSPITAL_COMMUNITY): Payer: Self-pay

## 2022-03-19 ENCOUNTER — Ambulatory Visit (HOSPITAL_COMMUNITY)
Admission: EM | Admit: 2022-03-19 | Discharge: 2022-03-19 | Disposition: A | Discharge status: Home / Self Care | Payer: Medicaid Other | Attending: Physician Assistant | Admitting: Physician Assistant

## 2022-03-19 DIAGNOSIS — K047 Periapical abscess without sinus: Secondary | ICD-10-CM | POA: Diagnosis not present

## 2022-03-19 DIAGNOSIS — K029 Dental caries, unspecified: Secondary | ICD-10-CM

## 2022-03-19 DIAGNOSIS — K0889 Other specified disorders of teeth and supporting structures: Secondary | ICD-10-CM

## 2022-03-19 MED ORDER — CLINDAMYCIN HCL 300 MG PO CAPS: 300.0000 mg | ORAL_CAPSULE | Freq: Three times a day (TID) | ORAL | 0 refills | Status: DC

## 2022-03-19 NOTE — ED Triage Notes (Signed)
Pt presents with complaints of dental pain that goes between the left and right side. More so on the left currently. Concerned for dental abscess. X 1 week.

## 2022-03-19 NOTE — ED Provider Notes (Signed)
[facility]    CSN: [id number] Arrival date & time: 03/19/22  1357      History   Chief Complaint Chief Complaint  Patient presents with   Dental Pain    HPI Shannon Archer is a 36 y.o. female.   36 year old female presents with dental pain.  Patient indicates that she has problems with her teeth, having dental caries and some broken teeth.  She indicates that the past several days she is noted some pain and discomfort of the upper front part of the gum.  She indicates soreness, tenderness, which is worse at night.  She has not noticed any drainage from the area yet.  She is concerned about having an abscess and she is taking Advil 600 mg at bedtime to help relieve the pain.  Patient indicates that she does not currently have an appointment with the dentist due to financial constraints.  She denies any fever or chills   Dental Pain   Past Medical History:  Diagnosis Date   Gestational diabetes 2018   Normal 2 hr PP GTT   History of preterm labor    Tobacco abuse 01/14/2017    There are no problems to display for this patient.   Past Surgical History:  Procedure Laterality Date   CESAREAN SECTION  2011   [place]   CESAREAN SECTION WITH BILATERAL TUBAL LIGATION N/A 06/24/2017   Procedure: CESAREAN SECTION WITH BILATERAL TUBAL LIGATION;  Surgeon: Levie Heritage, DO;  Location: [facility];  Service: Obstetrics;  Laterality: N/A;   DILATION AND EVACUATION N/A 08/11/2014   Procedure: DILATATION AND EVACUATION;  Surgeon: Juluis Mire, MD;  Location: [facility];  Service: Gynecology;  Laterality: N/A;   NOSE SURGERY      OB History     Gravida  3   Para  2   Term  1   Preterm  1   AB  1   Living  1      SAB  1   IAB  0   Ectopic  0   Multiple  0   Live Births  1        Obstetric Comments  36wks spontaneous PTB (PPROM). Previous C/S - Breech Presentation.           Home Medications    Prior to Admission medications    Medication Sig Start Date End Date Taking? Authorizing Provider  clindamycin (CLEOCIN) 300 MG capsule Take 1 capsule (300 mg total) by mouth 3 (three) times daily. 03/19/22  Yes Ellsworth Lennox, PA-C  albuterol (VENTOLIN HFA) 108 (90 Base) MCG/ACT inhaler Inhale 1 puff into the lungs every 6 (six) hours as needed for wheezing or shortness of breath. 02/03/20   Lamptey, Britta Mccreedy, MD  benzonatate (TESSALON) 100 MG capsule Take 1 capsule (100 mg total) by mouth every 8 (eight) hours. 02/03/20   Merrilee Jansky, MD  escitalopram (LEXAPRO) 10 MG tablet Take 1 tablet (10 mg total) by mouth daily. 08/05/17   Anyanwu, Jethro Bastos, MD  ferrous sulfate 325 (65 FE) MG tablet Take 1 tablet (325 mg total) by mouth 2 (two) times daily with a meal. Patient not taking: Reported on 07/07/2017 06/26/17 02/03/20  Arabella Merles, CNM    Family History Family History  Problem Relation Age of Onset   Heart disease Maternal Grandmother    Cancer Maternal Grandmother    Diabetes Maternal Grandmother    Diabetes Maternal Grandfather    Cancer Paternal Grandmother  Social History Social History   Tobacco Use   Smoking status: Every Day    Packs/day: 0.50    Types: Cigarettes   Smokeless tobacco: Never  Substance Use Topics   Alcohol use: No   Drug use: No     Allergies   Penicillins   Review of Systems Review of Systems  HENT:  Positive for dental problem (dental caries and broken teeth).      Physical Exam Triage Vital Signs ED Triage Vitals  Enc Vitals Group     BP 03/19/22 1513 112/75     Pulse Rate 03/19/22 1513 67     Resp 03/19/22 1513 17     Temp 03/19/22 1513 98.3 F (36.8 C)     Temp src --      SpO2 03/19/22 1513 98 %     Weight --      Height --      Head Circumference --      Peak Flow --      Pain Score 03/19/22 1512 9     Pain Loc --      Pain Edu? --      Excl. in [place]? --    No data found.  Updated Vital Signs BP 112/75   Pulse 67   Temp 98.3 F (36.8 C)   Resp  17   LMP 03/05/2022   SpO2 98%   Visual Acuity Right Eye Distance:   Left Eye Distance:   Bilateral Distance:    Right Eye Near:   Left Eye Near:    Bilateral Near:     Physical Exam Constitutional:      Appearance: Normal appearance.  HENT:     Right Ear: Tympanic membrane and ear canal normal.     Left Ear: Tympanic membrane and ear canal normal.     Mouth/Throat:     Dentition: Dental caries (multiple areas and broken teeth) and dental abscesses (above upper front teeth.) present.  Cardiovascular:     Rate and Rhythm: Normal rate and regular rhythm.     Heart sounds: Normal heart sounds.  Lymphadenopathy:     Cervical: No cervical adenopathy.  Neurological:     Mental Status: She is alert.      UC Treatments / Results  Labs (all labs ordered are listed, but only abnormal results are displayed) Labs Reviewed - No data to display  EKG   Radiology No results found.  Procedures Procedures (including critical care time)  Medications Ordered in UC Medications - No data to display  Initial Impression / Assessment and Plan / UC Course  I have reviewed the triage vital signs and the nursing notes.  Pertinent labs & imaging results that were available during my care of the patient were reviewed by me and considered in my medical decision making (see chart for details).       Plan: 1.  Advised to take clindamycin 300 mg every 8 hours to treat the abscessed tooth. 2.  Advised to continue taking Advil every 8 hours or as needed to help reduce the pain and swelling. 3.  Information for [facility] dental resources has been given to the patient. 4.  Advised to follow-up with PCP or return to urgent care if symptoms fail to improve. Final Clinical Impressions(s) / UC Diagnoses   Final diagnoses:  Pain, dental  Dental caries  Dental abscess     Discharge Instructions      Advised to continue to use ibuprofen for  100 to 600 mg as needed for pain  relief. Advised to take the clindamycin 300 mg every 8 hours to treat the dental abscess infection. Advised to become established with a dentist as soon as feasibly possible. [facility] resource for dental contact number: [phone]. As an alternate doctor [place] at [phone]. Advised to follow-up with PCP or return to urgent care if symptoms fail to improve.     ED Prescriptions     Medication Sig Dispense Auth. Provider   clindamycin (CLEOCIN) 300 MG capsule Take 1 capsule (300 mg total) by mouth 3 (three) times daily. 30 capsule Ellsworth Lennox, PA-C      PDMP not reviewed this encounter.   Ellsworth Lennox, PA-C 03/19/22 1553

## 2022-03-19 NOTE — Discharge Instructions (Addendum)
Advised to continue to use ibuprofen for 100 to 600 mg as needed for pain relief. Advised to take the clindamycin 300 mg every 8 hours to treat the dental abscess infection. Advised to become established with a dentist as soon as feasibly possible. [facility] resource for dental contact number: [phone]. As an alternate doctor [place] at [phone]. Advised to follow-up with PCP or return to urgent care if symptoms fail to improve.

## 2022-11-23 ENCOUNTER — Other Ambulatory Visit: Payer: Self-pay

## 2022-11-23 ENCOUNTER — Encounter (HOSPITAL_COMMUNITY): Payer: Self-pay | Admitting: Emergency Medicine

## 2022-11-23 ENCOUNTER — Ambulatory Visit (HOSPITAL_COMMUNITY)
Admission: EM | Admit: 2022-11-23 | Discharge: 2022-11-23 | Disposition: A | Discharge status: Home / Self Care | Payer: Medicaid Other | Attending: Physician Assistant | Admitting: Physician Assistant

## 2022-11-23 DIAGNOSIS — M792 Neuralgia and neuritis, unspecified: Secondary | ICD-10-CM

## 2022-11-23 MED ORDER — BACLOFEN 10 MG PO TABS: 10.0000 mg | ORAL_TABLET | Freq: Three times a day (TID) | ORAL | 0 refills | Status: DC

## 2022-11-23 MED ORDER — METHYLPREDNISOLONE 4 MG PO TBPK: ORAL_TABLET | ORAL | 0 refills | Status: DC

## 2022-11-23 MED ORDER — GABAPENTIN 300 MG PO CAPS: 300.0000 mg | ORAL_CAPSULE | Freq: Every day | ORAL | 0 refills | Status: DC

## 2022-11-23 NOTE — Discharge Instructions (Signed)
It was good to meet you today.  I think that you are having nerve pain of your right shoulder, likely from overuse.  We can usually treat this with a steroid taper, muscle relaxer, and PT.  You can take the gabapentin at bedtime to help with nerve irritation as well.  Please call sports medicine and get scheduled for follow-up and assistance with pursuing physical therapy.  You may use ice or heat.  Gentle stretches of this area.  Limit overuse activities.

## 2022-11-23 NOTE — ED Provider Notes (Signed)
[facility]   MRN: [id number] DOB: 01-25-1986  Subjective:   Shannon Archer is a 37 y.o. female presenting for right shoulder and arm pain for the last 1-1/2 months.  She is right-handed.  She works at a [employer].  She reports that symptoms started after she was throwing a ball and playing fetch more than she normally does.  Now feels like she is having what she calls pain and irritation either coming from her neck, shoulder, or scapula.  She has been taking ibuprofen.  Pain has been waking her up at night sometimes.  She describes it as a burning sensation.  Worse with external rotation and overhead activities.  No prior injury or surgery to this arm.  No current facility-administered medications for this encounter.  Current Outpatient Medications:    baclofen (LIORESAL) 10 MG tablet, Take 1 tablet (10 mg total) by mouth 3 (three) times daily. Take for muscle relaxation., Disp: 30 each, Rfl: 0   gabapentin (NEURONTIN) 300 MG capsule, Take 1 capsule (300 mg total) by mouth at bedtime. Take for nerve pain., Disp: 20 capsule, Rfl: 0   methylPREDNISolone (MEDROL DOSEPAK) 4 MG TBPK tablet, Please take per packaging instructions., Disp: 21 tablet, Rfl: 0   albuterol (VENTOLIN HFA) 108 (90 Base) MCG/ACT inhaler, Inhale 1 puff into the lungs every 6 (six) hours as needed for wheezing or shortness of breath., Disp: 18 g, Rfl: 0   benzonatate (TESSALON) 100 MG capsule, Take 1 capsule (100 mg total) by mouth every 8 (eight) hours. (Patient not taking: Reported on 11/23/2022), Disp: 21 capsule, Rfl: 0   clindamycin (CLEOCIN) 300 MG capsule, Take 1 capsule (300 mg total) by mouth 3 (three) times daily. (Patient not taking: Reported on 11/23/2022), Disp: 30 capsule, Rfl: 0   escitalopram (LEXAPRO) 10 MG tablet, Take 1 tablet (10 mg total) by mouth daily. (Patient not taking: Reported on 11/23/2022), Disp: 30 tablet, Rfl: 3   Allergies  Allergen Reactions   Penicillins Nausea And  Vomiting    Has patient had a PCN reaction causing immediate rash, facial/tongue/throat swelling, SOB or lightheadedness with hypotension: No Has patient had a PCN reaction causing severe rash involving mucus membranes or skin necrosis: No Has patient had a PCN reaction that required hospitalization: No Has patient had a PCN reaction occurring within the last 10 years: No If all of the above answers are "NO", then may proceed with Cephalosporin use.     Past Medical History:  Diagnosis Date   Gestational diabetes 2018   Normal 2 hr PP GTT   History of preterm labor    Tobacco abuse 01/14/2017     Past Surgical History:  Procedure Laterality Date   CESAREAN SECTION  2011   [facility]   CESAREAN SECTION WITH BILATERAL TUBAL LIGATION N/A 06/24/2017   Procedure: CESAREAN SECTION WITH BILATERAL TUBAL LIGATION;  Surgeon: Levie Heritage, DO;  Location: [facility];  Service: Obstetrics;  Laterality: N/A;   DILATION AND EVACUATION N/A 08/11/2014   Procedure: DILATATION AND EVACUATION;  Surgeon: Juluis Mire, MD;  Location: [facility];  Service: Gynecology;  Laterality: N/A;   NOSE SURGERY      Family History  Problem Relation Age of Onset   Heart disease Maternal Grandmother    Cancer Maternal Grandmother    Diabetes Maternal Grandmother    Diabetes Maternal Grandfather    Cancer Paternal Grandmother     Social History   Tobacco Use   Smoking  status: Every Day    Packs/day: .5    Types: Cigarettes   Smokeless tobacco: Never  Vaping Use   Vaping Use: Never used  Substance Use Topics   Alcohol use: No   Drug use: Yes    Types: Marijuana    ROS REFER TO HPI FOR PERTINENT POSITIVES AND NEGATIVES   Objective:   Vitals: BP 136/89 (BP Location: Left Arm)   Pulse 93   Temp 98.2 F (36.8 C) (Oral)   Resp 20   LMP 11/20/2022   SpO2 99%   Physical Exam Vitals and nursing note reviewed.  Constitutional:      Appearance: Normal appearance.  Cardiovascular:      Pulses: Normal pulses.  Musculoskeletal:     Right shoulder: Tenderness (right scapular region minimal TTP) present. No bony tenderness. Normal range of motion. Normal strength. Normal pulse.     Comments: Grimaces with ROM with R shoulder, but has full ROM.   Neurological:     Mental Status: She is alert.     Sensory: No sensory deficit.     Motor: No weakness.  Psychiatric:        Mood and Affect: Mood normal.     No results found for this or any previous visit (from the past 24 hour(s)).  Assessment and Plan :   PDMP not reviewed this encounter.  1. Neuropathic pain of right shoulder    No direct trauma, no indication for imaging today.  Patient has full range of motion, albeit with some pain.  Pain she describes sounds consistent with scapular nerve irritation.  Plan to try steroid taper, baclofen, gabapentin as directed. Pt aware of risks vs benefits and possible adverse reactions.  I think she would benefit from evaluation with sports medicine and physical therapy.  Referral placed today.  Patient understanding and agreeable with plan.  Return precautions discussed with her.     AllwardtCrist Infante, PA-C 11/23/22 1557

## 2022-11-23 NOTE — ED Triage Notes (Addendum)
Right shoulder pain for 1 1/2 months.   Patient thinks related to tossing a ball/playing fetch with a dog.  Patient has not been evaluated by anyone.  In the last week, pain is worsening including at night.  Patient is right handed.  Patient has been taking ibuprofen.  Patient can lift arm over head.  Using arm does not make shoulder hurt.  Trying to relax, causes pain randomly in right neck, shoulder or upper arm.  Pain wakes her up at night, reported as a burning sensation  Constantly feels like shoulder is being "pulled on"

## 2022-12-08 ENCOUNTER — Ambulatory Visit (INDEPENDENT_AMBULATORY_CARE_PROVIDER_SITE_OTHER): Payer: Medicaid Other

## 2022-12-08 ENCOUNTER — Encounter (HOSPITAL_COMMUNITY): Payer: Self-pay

## 2022-12-08 ENCOUNTER — Ambulatory Visit (HOSPITAL_COMMUNITY)
Admission: EM | Admit: 2022-12-08 | Discharge: 2022-12-08 | Disposition: A | Discharge status: Home / Self Care | Payer: Medicaid Other | Attending: Emergency Medicine | Admitting: Emergency Medicine

## 2022-12-08 DIAGNOSIS — M5412 Radiculopathy, cervical region: Secondary | ICD-10-CM

## 2022-12-08 DIAGNOSIS — M62838 Other muscle spasm: Secondary | ICD-10-CM

## 2022-12-08 DIAGNOSIS — M25511 Pain in right shoulder: Secondary | ICD-10-CM

## 2022-12-08 DIAGNOSIS — M62830 Muscle spasm of back: Secondary | ICD-10-CM

## 2022-12-08 MED ORDER — ACETAMINOPHEN 325 MG PO TABS
ORAL_TABLET | ORAL | Status: AC
  Filled 2022-12-08: qty 3

## 2022-12-08 MED ORDER — LIDOCAINE 5 % EX PTCH: MEDICATED_PATCH | CUTANEOUS | 0 refills | Status: DC

## 2022-12-08 MED ORDER — ACETAMINOPHEN 500 MG PO TABS: 1000.0000 mg | ORAL_TABLET | Freq: Three times a day (TID) | ORAL | 0 refills | Status: AC | PRN

## 2022-12-08 MED ORDER — KETOROLAC TROMETHAMINE 30 MG/ML IJ SOLN
INTRAMUSCULAR | Status: AC
  Filled 2022-12-08: qty 1

## 2022-12-08 MED ORDER — KETOROLAC TROMETHAMINE 30 MG/ML IJ SOLN
30.0000 mg | Freq: Once | INTRAMUSCULAR | Status: AC
  Administered 2022-12-08: 30 mg via INTRAMUSCULAR

## 2022-12-08 MED ORDER — BACLOFEN 10 MG PO TABS: 10.0000 mg | ORAL_TABLET | Freq: Three times a day (TID) | ORAL | 0 refills | Status: AC

## 2022-12-08 MED ORDER — IBUPROFEN 600 MG PO TABS: 600.0000 mg | ORAL_TABLET | Freq: Three times a day (TID) | ORAL | 0 refills | Status: AC

## 2022-12-08 MED ORDER — ACETAMINOPHEN 325 MG PO TABS
975.0000 mg | ORAL_TABLET | Freq: Once | ORAL | Status: AC
  Administered 2022-12-08: 975 mg via ORAL

## 2022-12-08 NOTE — ED Triage Notes (Signed)
Patient reports that she has had right shoulder pain x 2 1/2 months after throwing a tennis ball with a dog. Patient reports that she was seen on 11/23/22 for the same. Patient states she has not had any relief despite taking the meds that were prescribed

## 2022-12-08 NOTE — Discharge Instructions (Addendum)
The x-ray of your right shoulder did not show any acute injury or chronic degenerative changes (arthritis).  This is good news.  Believe that the pain you are experiencing at this time is related to the significant muscle tension in the muscles between the your right shoulder and your neck and the muscles on the right side of your neck.  Believe that this tension is pinching the nerves that radiate down your arm which makes them ache and burn.  Complete this is why you have relief when you put your shoulder in certain positions and when I be baclofen helps you more than the gabapentin nerve medication and the methylprednisolone steroid anti-inflammatory medication.  I provided you with a refill of the muscle relaxer, baclofen that you can take 3 times daily as needed.  If you find that this makes you too sleepy, you can take half tablet during the day and 2 tablets at bedtime.  Please do not exceed 3 tablets or 30 mg/day.  As we discussed, recommend taking about milligrams of Tylenol 3 times daily to tell your brain to ignore pain.  Please take this on a scheduled basis for best results.  Recommend that you take ibuprofen 6 mg 3 times daily as needed for pain not well relieved by Tylenol.    I also provided you with a prescription for lidocaine patches that can be applied every 12 hours as needed for pain relief.  Medicaid does not pay for this medication without a prior authorization, which we cannot provide for you here at urgent care, so printed a good Rx coupon for you to pay for 10 patches.  Just let your pharmacist know.  Also enclosed, you will find information regarding nerve flossing exercises that you can perform 3 times daily at home.  If you find that these particular exercises are not helpful, there is a wealth of information on the Internet regarding median nerve flossing, ulnar nerve flossing, radial nerve flossing and brachial nerve flossing.  With a little trial and error, and confident  you will find the exercises that help you the best.  Please consider reaching out to your primary care provider to discuss referral to physical therapy if none of these recommendations provided you with meaningful benefit.  Finally, I recommend that you remain out of work for a few days and avoid any activity that causes you pain or tension in the muscles around your neck and shoulder.  Thank you for visiting [facility] today.  We appreciate the opportunity to participate your care.

## 2022-12-08 NOTE — ED Provider Notes (Signed)
[facility]    CSN: [id number] Arrival date & time: 12/08/22  1034    HISTORY   Chief Complaint  Patient presents with   Shoulder Pain   HPI Shannon Archer is a pleasant, 37 y.o. female who presents to urgent care today. Patient states for the past 2 and half months she has had right shoulder, upper arm, forearm and neck pain.  Patient states she works at a [employer], throwing tennis balls and playing with dogs.  Patient states she was seen on May 18 for the same issue, despite taking the meds that were prescribed she has not experienced relief of her pain.  EMR reviewed, during that visit here at urgent care, she was provided with a prescription for baclofen, gabapentin and tapering dose of methylprednisolone.  Imaging was not performed during that visit.  Patient also reports tension in the muscles around her neck and shoulder on the right side which been present for longer than the right shoulder pain.  The history is provided by the patient.   Past Medical History:  Diagnosis Date   Gestational diabetes 2018   Normal 2 hr PP GTT   History of preterm labor    Tobacco abuse 01/14/2017   There are no problems to display for this patient.  Past Surgical History:  Procedure Laterality Date   CESAREAN SECTION  2011   [place]   CESAREAN SECTION WITH BILATERAL TUBAL LIGATION N/A 06/24/2017   Procedure: CESAREAN SECTION WITH BILATERAL TUBAL LIGATION;  Surgeon: Levie Heritage, DO;  Location: [facility];  Service: Obstetrics;  Laterality: N/A;   DILATION AND EVACUATION N/A 08/11/2014   Procedure: DILATATION AND EVACUATION;  Surgeon: Juluis Mire, MD;  Location: [facility];  Service: Gynecology;  Laterality: N/A;   NOSE SURGERY     OB History     Gravida  3   Para  2   Term  1   Preterm  1   AB  1   Living  1      SAB  1   IAB  0   Ectopic  0   Multiple  0   Live Births  1        Obstetric Comments  36wks spontaneous PTB (PPROM). Previous  C/S - Breech Presentation.         Home Medications    Prior to Admission medications   Medication Sig Start Date End Date Taking? Authorizing Provider  albuterol (VENTOLIN HFA) 108 (90 Base) MCG/ACT inhaler Inhale 1 puff into the lungs every 6 (six) hours as needed for wheezing or shortness of breath. 02/03/20   Lamptey, Britta Mccreedy, MD  baclofen (LIORESAL) 10 MG tablet Take 1 tablet (10 mg total) by mouth 3 (three) times daily. Take for muscle relaxation. 11/23/22   Allwardt, Crist Infante, PA-C  benzonatate (TESSALON) 100 MG capsule Take 1 capsule (100 mg total) by mouth every 8 (eight) hours. Patient not taking: Reported on 11/23/2022 02/03/20   Merrilee Jansky, MD  clindamycin (CLEOCIN) 300 MG capsule Take 1 capsule (300 mg total) by mouth 3 (three) times daily. Patient not taking: Reported on 11/23/2022 03/19/22   Ellsworth Lennox, PA-C  escitalopram (LEXAPRO) 10 MG tablet Take 1 tablet (10 mg total) by mouth daily. Patient not taking: Reported on 11/23/2022 08/05/17   Tereso Newcomer, MD  gabapentin (NEURONTIN) 300 MG capsule Take 1 capsule (300 mg total) by mouth at bedtime. Take for nerve pain. 11/23/22   Allwardt, Arlyss Repress  M, PA-C  methylPREDNISolone (MEDROL DOSEPAK) 4 MG TBPK tablet Please take per packaging instructions. 11/23/22   Allwardt, Crist Infante, PA-C  ferrous sulfate 325 (65 FE) MG tablet Take 1 tablet (325 mg total) by mouth 2 (two) times daily with a meal. Patient not taking: Reported on 07/07/2017 06/26/17 02/03/20  Arabella Merles, CNM    Family History Family History  Problem Relation Age of Onset   Heart disease Maternal Grandmother    Cancer Maternal Grandmother    Diabetes Maternal Grandmother    Diabetes Maternal Grandfather    Cancer Paternal Grandmother    Social History Social History   Tobacco Use   Smoking status: Every Day    Packs/day: .5    Types: Cigarettes   Smokeless tobacco: Never  Vaping Use   Vaping Use: Never used  Substance Use Topics   Alcohol use:  No   Drug use: Yes    Types: Marijuana   Allergies   Penicillins  Review of Systems Review of Systems Pertinent findings revealed after performing a 14 point review of systems has been noted in the history of present illness.  Physical Exam Vital Signs BP 114/80 (BP Location: Right Arm)   Pulse 80   Temp 97.7 F (36.5 C) (Oral)   Resp 14   LMP 11/20/2022   SpO2 98%   No data found.  Physical Exam Vitals and nursing note reviewed.  Constitutional:      General: She is not in acute distress.    Appearance: Normal appearance.  HENT:     Head: Normocephalic and atraumatic.  Eyes:     Pupils: Pupils are equal, round, and reactive to light.  Cardiovascular:     Rate and Rhythm: Normal rate and regular rhythm.  Pulmonary:     Effort: Pulmonary effort is normal.     Breath sounds: Normal breath sounds.  Musculoskeletal:        General: Normal range of motion.     Right shoulder: Tenderness present. No swelling, deformity, effusion, laceration, bony tenderness or crepitus. Normal range of motion. Decreased strength. Abnormal pulse.     Cervical back: Normal range of motion and neck supple. Spasms and tenderness present. No swelling, edema, deformity, signs of trauma, rigidity, torticollis, bony tenderness or crepitus. No pain with movement. Normal range of motion.  Skin:    General: Skin is warm and dry.  Neurological:     General: No focal deficit present.     Mental Status: She is alert and oriented to person, place, and time. Mental status is at baseline.  Psychiatric:        Mood and Affect: Mood normal.        Behavior: Behavior normal.        Thought Content: Thought content normal.        Judgment: Judgment normal.     UC Couse / Diagnostics / Procedures:     Radiology DG Shoulder Right  Result Date: 12/08/2022 CLINICAL DATA:  Right shoulder pain for 2 and half months. EXAM: RIGHT SHOULDER - 2+ VIEW COMPARISON:  None Available. FINDINGS: There is no evidence  of fracture or dislocation. There is no evidence of arthropathy or other focal bone abnormality. Soft tissues are unremarkable. IMPRESSION: Negative. Electronically Signed   By: Gerome Sam III M.D.   On: 12/08/2022 12:50    Procedures Procedures (including critical care time) EKG  Pending results:  Labs Reviewed - No data to display  Medications Ordered in UC: Medications  acetaminophen (TYLENOL) tablet 975 mg (975 mg Oral Given 12/08/22 1235)  ketorolac (TORADOL) 30 MG/ML injection 30 mg (30 mg Intramuscular Given 12/08/22 1236)    UC Diagnoses / Final Clinical Impressions(s)   I have reviewed the triage vital signs and the nursing notes.  Pertinent labs & imaging results that were available during my care of the patient were reviewed by me and considered in my medical decision making (see chart for details).    Final diagnoses:  Cervical paraspinous muscle spasm  Spasm of right trapezius muscle  Cervical radiculopathy  Acute pain of right shoulder    Patient advised of x-ray findings. Patient was provided with an injection of ketorolac during their visit today for acute pain relief. Take Ibuprofen 600 mg 3 times daily for the next 5 to 7 days Take muscle relaxer 3 times daily (Patient has been advised that if this makes them sleepy, they can just take this at bedtime, up to 20 mg per dose, and try breaking the tablets in half or 5 mg per dose during the day) Begin acetaminophen 1000 mg 3 times daily on a scheduled basis. Can try lidocaine patch every 12 hours to see if this is helpful. Apply ice pack to affected area 4 times daily for 20 minutes each time Consider physical therapy, chiropractic care, orthopedic follow-up Avoid stretching or strengthening exercises until pain is completely resolved Return precautions advised  Please see discharge instructions below for details of plan of care as provided to patient. ED Prescriptions     Medication Sig Dispense Auth.  Provider   baclofen (LIORESAL) 10 MG tablet Take 1 tablet (10 mg total) by mouth 3 (three) times daily for 10 days. 30 tablet Theadora Rama Scales, PA-C   acetaminophen (TYLENOL) 500 MG tablet Take 2 tablets (1,000 mg total) by mouth every 8 (eight) hours as needed for mild pain. 180 tablet Theadora Rama Scales, PA-C   ibuprofen (ADVIL) 600 MG tablet Take 1 tablet (600 mg total) by mouth 3 (three) times daily for 10 days. Take 1 tablet 3 times daily as needed for inflammation of upper airways and/or pain. 30 tablet Theadora Rama Scales, PA-C   lidocaine (LIDODERM) 5 % Place on skin overlying affected area every 12 hours. 30 patch Theadora Rama Scales, PA-C      PDMP not reviewed this encounter.  Discharge Instructions:   Discharge Instructions      The x-ray of your right shoulder did not show any acute injury or chronic degenerative changes (arthritis).  This is good news.  Believe that the pain you are experiencing at this time is related to the significant muscle tension in the muscles between the your right shoulder and your neck and the muscles on the right side of your neck.  Believe that this tension is pinching the nerves that radiate down your arm which makes them ache and burn.  Complete this is why you have relief when you put your shoulder in certain positions and when I be baclofen helps you more than the gabapentin nerve medication and the methylprednisolone steroid anti-inflammatory medication.  I provided you with a refill of the muscle relaxer, baclofen that you can take 3 times daily as needed.  If you find that this makes you too sleepy, you can take half tablet during the day and 2 tablets at bedtime.  Please do not exceed 3 tablets or 30 mg/day.  As we discussed, recommend taking about milligrams of Tylenol 3 times daily to tell your  brain to ignore pain.  Please take this on a scheduled basis for best results.  Recommend that you take ibuprofen 6 mg 3 times daily  as needed for pain not well relieved by Tylenol.    I also provided you with a prescription for lidocaine patches that can be applied every 12 hours as needed for pain relief.  Medicaid does not pay for this medication without a prior authorization, which we cannot provide for you here at urgent care, so printed a good Rx coupon for you to pay for 10 patches.  Just let your pharmacist know.  Also enclosed, you will find information regarding nerve flossing exercises that you can perform 3 times daily at home.  If you find that these particular exercises are not helpful, there is a wealth of information on the Internet regarding median nerve flossing, ulnar nerve flossing, radial nerve flossing and brachial nerve flossing.  With a little trial and error, and confident you will find the exercises that help you the best.  Please consider reaching out to your primary care provider to discuss referral to physical therapy if none of these recommendations provided you with meaningful benefit.  Finally, I recommend that you remain out of work for a few days and avoid any activity that causes you pain or tension in the muscles around your neck and shoulder.  Thank you for visiting [facility] today.  We appreciate the opportunity to participate your care.        Disposition Upon Discharge:  Condition: stable for discharge home Home: take medications as prescribed; routine discharge instructions as discussed; follow up as advised.  Patient presented with an acute illness with associated systemic symptoms and significant discomfort requiring urgent management. In my opinion, this is a condition that a prudent lay person (someone who possesses an average knowledge of health and medicine) may potentially expect to result in complications if not addressed urgently such as respiratory distress, impairment of bodily function or dysfunction of bodily organs.   Routine symptom specific, illness  specific and/or disease specific instructions were discussed with the patient and/or caregiver at length.   As such, the patient has been evaluated and assessed, work-up was performed and treatment was provided in alignment with urgent care protocols and evidence based medicine.  Patient/parent/caregiver has been advised that the patient may require follow up for further testing and treatment if the symptoms continue in spite of treatment, as clinically indicated and appropriate.  Patient/parent/caregiver has been advised to report to orthopedic urgent care clinic or return to the [facility] or PCP in 3-5 days if no better; follow-up with orthopedics, PCP or the Emergency Department if new signs and symptoms develop or if the current signs or symptoms continue to change or worsen for further workup, evaluation and treatment as clinically indicated and appropriate  The patient will follow up with their current PCP if and as advised. If the patient does not currently have a PCP we will have assisted them in obtaining one.   The patient may need specialty follow up if the symptoms continue, in spite of conservative treatment and management, for further workup, evaluation, consultation and treatment as clinically indicated and appropriate.  Patient/parent/caregiver verbalized understanding and agreement of plan as discussed.  All questions were addressed during visit.  Please see discharge instructions below for further details of plan.  This office note has been dictated using Teaching laboratory technician.  Unfortunately, this method of dictation can sometimes lead to typographical  or grammatical errors.  I apologize for your inconvenience in advance if this occurs.  Please do not hesitate to reach out to me if clarification is needed.      Theadora Rama Scales, PA-C 12/09/22 1428

## 2024-02-07 ENCOUNTER — Ambulatory Visit (HOSPITAL_COMMUNITY)
Admission: EM | Admit: 2024-02-07 | Discharge: 2024-02-07 | Disposition: A | Discharge status: Home / Self Care | Attending: Emergency Medicine | Admitting: Emergency Medicine

## 2024-02-07 ENCOUNTER — Encounter (HOSPITAL_COMMUNITY): Payer: Self-pay | Admitting: Emergency Medicine

## 2024-02-07 DIAGNOSIS — N3001 Acute cystitis with hematuria: Secondary | ICD-10-CM | POA: Diagnosis present

## 2024-02-07 DIAGNOSIS — Z3202 Encounter for pregnancy test, result negative: Secondary | ICD-10-CM

## 2024-02-07 DIAGNOSIS — R300 Dysuria: Secondary | ICD-10-CM | POA: Diagnosis present

## 2024-02-07 LAB — POCT URINALYSIS DIP (MANUAL ENTRY)
Bilirubin, UA: NEGATIVE
Glucose, UA: NEGATIVE mg/dL
Ketones, POC UA: NEGATIVE mg/dL
Leukocytes, UA: NEGATIVE
Nitrite, UA: NEGATIVE
Protein Ur, POC: NEGATIVE mg/dL
Spec Grav, UA: 1.025 (ref 1.010–1.025)
Urobilinogen, UA: 0.2 U/dL
pH, UA: 5.5 (ref 5.0–8.0)

## 2024-02-07 LAB — POCT URINE PREGNANCY: Preg Test, Ur: NEGATIVE

## 2024-02-07 MED ORDER — SULFAMETHOXAZOLE-TRIMETHOPRIM 800-160 MG PO TABS: 1.0000 | ORAL_TABLET | Freq: Two times a day (BID) | ORAL | 0 refills | Status: AC

## 2024-02-07 NOTE — Discharge Instructions (Signed)
 Start taking Bactrim  twice daily for 3 days for urinary tract infection. You can continue to take the over-the-counter urinary relief medication for your symptoms as this is providing you some relief. Your urine culture will return over the next few days and someone will call if results require any adjustment in treatment.

## 2024-02-07 NOTE — ED Triage Notes (Signed)
 Pt reports since Monday had pain with urination. Tried treating at home with AZO OTC medication but not helping. Denies blood in urine this time.

## 2024-02-07 NOTE — ED Provider Notes (Signed)
 [facility]    CSN: [id number] Arrival date & time: 02/07/24  1454      History   Chief Complaint Chief Complaint  Patient presents with   Dysuria    HPI Shannon Archer is a 38 y.o. female.   Patient presents with dysuria, urinary frequency/urgency, and bladder pressure that began on 7/28.  Patient states that she has been taking over-the-counter urinary relief medication that has helped some, but not completely.  Patient denies hematuria, abdominal pain, flank pain, fever, body aches, chills, weakness, nausea, and vomiting.  LMP 7/7.  The history is provided by the patient and medical records.  Dysuria   Past Medical History:  Diagnosis Date   Gestational diabetes 2018   Normal 2 hr PP GTT   History of preterm labor    Tobacco abuse 01/14/2017    There are no active problems to display for this patient.   Past Surgical History:  Procedure Laterality Date   CESAREAN SECTION  2011   Breech   CESAREAN SECTION WITH BILATERAL TUBAL LIGATION N/A 06/24/2017   Procedure: CESAREAN SECTION WITH BILATERAL TUBAL LIGATION;  Surgeon: Barbra Lang PARAS, DO;  Location: [facility];  Service: Obstetrics;  Laterality: N/A;   DILATION AND EVACUATION N/A 08/11/2014   Procedure: DILATATION AND EVACUATION;  Surgeon: Norleen GORMAN Skill, MD;  Location: [facility];  Service: Gynecology;  Laterality: N/A;   NOSE SURGERY      OB History     Gravida  3   Para  2   Term  1   Preterm  1   AB  1   Living  1      SAB  1   IAB  0   Ectopic  0   Multiple  0   Live Births  1        Obstetric Comments  36wks spontaneous PTB (PPROM). Previous C/S - Breech Presentation.           Home Medications    Prior to Admission medications   Medication Sig Start Date End Date Taking? Authorizing Provider  sulfamethoxazole -trimethoprim  (BACTRIM  DS) 800-160 MG tablet Take 1 tablet by mouth 2 (two) times daily for 3 days. 02/07/24 02/10/24 Yes Johnie Flaming A, NP  albuterol   (VENTOLIN  HFA) 108 (90 Base) MCG/ACT inhaler Inhale 1 puff into the lungs every 6 (six) hours as needed for wheezing or shortness of breath. 02/03/20   Lamptey, Aleene KIDD, MD  ferrous sulfate  325 (65 FE) MG tablet Take 1 tablet (325 mg total) by mouth 2 (two) times daily with a meal. Patient not taking: Reported on 07/07/2017 06/26/17 02/03/20  Loreli Suzen BIRCH, CNM    Family History Family History  Problem Relation Age of Onset   Heart disease Maternal Grandmother    Cancer Maternal Grandmother    Diabetes Maternal Grandmother    Diabetes Maternal Grandfather    Cancer Paternal Grandmother     Social History Social History   Tobacco Use   Smoking status: Every Day    Current packs/day: 0.50    Types: Cigarettes   Smokeless tobacco: Never  Vaping Use   Vaping status: Never Used  Substance Use Topics   Alcohol use: No   Drug use: Yes    Types: Marijuana     Allergies   Penicillins   Review of Systems Review of Systems  Genitourinary:  Positive for dysuria.   Per HPI  Physical Exam Triage Vital Signs ED Triage Vitals  Encounter Vitals  Group     BP 02/07/24 1538 111/65     Girls Systolic BP Percentile --      Girls Diastolic BP Percentile --      Boys Systolic BP Percentile --      Boys Diastolic BP Percentile --      Pulse Rate 02/07/24 1538 92     Resp 02/07/24 1538 15     Temp 02/07/24 1538 98.3 F (36.8 C)     Temp Source 02/07/24 1538 Oral     SpO2 02/07/24 1538 98 %     Weight --      Height --      Head Circumference --      Peak Flow --      Pain Score 02/07/24 1537 0     Pain Loc --      Pain Education --      Exclude from Growth Chart --    No data found.  Updated Vital Signs BP 111/65 (BP Location: Left Arm)   Pulse 92   Temp 98.3 F (36.8 C) (Oral)   Resp 15   LMP 01/12/2024 (Approximate)   SpO2 98%   Visual Acuity Right Eye Distance:   Left Eye Distance:   Bilateral Distance:    Right Eye Near:   Left Eye Near:    Bilateral  Near:     Physical Exam Vitals and nursing note reviewed.  Constitutional:      General: She is awake. She is not in acute distress.    Appearance: Normal appearance. She is well-developed and well-groomed. She is not ill-appearing.  Abdominal:     General: Abdomen is flat.     Palpations: Abdomen is soft.     Tenderness: There is no abdominal tenderness. There is no right CVA tenderness, left CVA tenderness, guarding or rebound.  Skin:    General: Skin is warm and dry.  Neurological:     Mental Status: She is alert.  Psychiatric:        Behavior: Behavior is cooperative.      UC Treatments / Results  Labs (all labs ordered are listed, but only abnormal results are displayed) Labs Reviewed  POCT URINALYSIS DIP (MANUAL ENTRY) - Abnormal; Notable for the following components:      Result Value   Blood, UA small (*)    All other components within normal limits  URINE CULTURE  POCT URINE PREGNANCY    EKG   Radiology No results found.  Procedures Procedures (including critical care time)  Medications Ordered in UC Medications - No data to display  Initial Impression / Assessment and Plan / UC Course  I have reviewed the triage vital signs and the nursing notes.  Pertinent labs & imaging results that were available during my care of the patient were reviewed by me and considered in my medical decision making (see chart for details).     Patient is overall well-appearing.  Vitals are stable.  No significant findings on exam.  Urinalysis reveals small RBCs, will send culture.  Urine pregnancy negative.  Empirically treated with Bactrim  for urinary tract infection due to acute symptoms.  Discussed follow-up and return precautions. Final Clinical Impressions(s) / UC Diagnoses   Final diagnoses:  Dysuria  Acute cystitis with hematuria     Discharge Instructions      Start taking Bactrim  twice daily for 3 days for urinary tract infection. You can continue to take  the over-the-counter urinary relief medication for your symptoms  as this is providing you some relief. Your urine culture will return over the next few days and someone will call if results require any adjustment in treatment.    ED Prescriptions     Medication Sig Dispense Auth. Provider   sulfamethoxazole -trimethoprim  (BACTRIM  DS) 800-160 MG tablet Take 1 tablet by mouth 2 (two) times daily for 3 days. 6 tablet Johnie Flaming A, NP      PDMP not reviewed this encounter.   Johnie Flaming A, NP 02/07/24 [phone]

## 2024-02-08 LAB — URINE CULTURE: Culture: NO GROWTH

## 2024-02-09 ENCOUNTER — Ambulatory Visit (HOSPITAL_COMMUNITY): Payer: Self-pay
